# Patient Record
Sex: Male | Born: 1985
Health system: Southern US, Community
[De-identification: ages and names within clinical notes are randomized; demographics above are authoritative.]

## PROBLEM LIST (undated history)

## (undated) DIAGNOSIS — K644 Residual hemorrhoidal skin tags: Secondary | ICD-10-CM

## (undated) DIAGNOSIS — I4892 Unspecified atrial flutter: Secondary | ICD-10-CM

## (undated) DIAGNOSIS — K602 Anal fissure, unspecified: Secondary | ICD-10-CM

## (undated) DIAGNOSIS — I071 Rheumatic tricuspid insufficiency: Secondary | ICD-10-CM

## (undated) DIAGNOSIS — G43909 Migraine, unspecified, not intractable, without status migrainosus: Secondary | ICD-10-CM

## (undated) DIAGNOSIS — J45909 Unspecified asthma, uncomplicated: Secondary | ICD-10-CM

## (undated) DIAGNOSIS — K219 Gastro-esophageal reflux disease without esophagitis: Secondary | ICD-10-CM

## (undated) DIAGNOSIS — M1990 Unspecified osteoarthritis, unspecified site: Secondary | ICD-10-CM

## (undated) DIAGNOSIS — I340 Nonrheumatic mitral (valve) insufficiency: Secondary | ICD-10-CM

## (undated) DIAGNOSIS — I498 Other specified cardiac arrhythmias: Secondary | ICD-10-CM

## (undated) DIAGNOSIS — Z720 Tobacco use: Secondary | ICD-10-CM

## (undated) DIAGNOSIS — F1290 Cannabis use, unspecified, uncomplicated: Secondary | ICD-10-CM

## (undated) DIAGNOSIS — K5090 Crohn's disease, unspecified, without complications: Secondary | ICD-10-CM

## (undated) HISTORY — DX: Residual hemorrhoidal skin tags: K64.4

## (undated) HISTORY — DX: Anal fissure, unspecified: K60.2

## (undated) HISTORY — PX: COLONOSCOPY: SHX174

## (undated) HISTORY — DX: Gastro-esophageal reflux disease without esophagitis: K21.9

## (undated) HISTORY — DX: Crohn's disease, unspecified, without complications: K50.90

---

## 2005-08-21 ENCOUNTER — Emergency Department: Payer: Self-pay | Admitting: Emergency Medicine

## 2005-08-29 ENCOUNTER — Ambulatory Visit: Payer: Self-pay | Admitting: Gastroenterology

## 2006-02-12 ENCOUNTER — Inpatient Hospital Stay: Payer: Self-pay | Admitting: Internal Medicine

## 2006-04-24 ENCOUNTER — Inpatient Hospital Stay: Payer: Self-pay | Admitting: Internal Medicine

## 2006-06-16 ENCOUNTER — Emergency Department: Payer: Self-pay | Admitting: Emergency Medicine

## 2007-03-01 ENCOUNTER — Emergency Department: Payer: Self-pay | Admitting: Internal Medicine

## 2007-03-01 ENCOUNTER — Other Ambulatory Visit: Payer: Self-pay

## 2007-03-20 ENCOUNTER — Ambulatory Visit: Payer: Self-pay | Admitting: Urology

## 2007-05-02 ENCOUNTER — Ambulatory Visit: Payer: Self-pay | Admitting: Rheumatology

## 2007-06-25 ENCOUNTER — Emergency Department: Payer: Self-pay | Admitting: Emergency Medicine

## 2007-09-19 ENCOUNTER — Emergency Department: Payer: Self-pay | Admitting: Internal Medicine

## 2010-11-18 ENCOUNTER — Ambulatory Visit: Payer: Self-pay | Admitting: Unknown Physician Specialty

## 2010-12-06 ENCOUNTER — Ambulatory Visit: Payer: Self-pay | Admitting: Unknown Physician Specialty

## 2011-12-02 DIAGNOSIS — L259 Unspecified contact dermatitis, unspecified cause: Secondary | ICD-10-CM | POA: Diagnosis not present

## 2011-12-02 DIAGNOSIS — L708 Other acne: Secondary | ICD-10-CM | POA: Diagnosis not present

## 2012-01-04 DIAGNOSIS — L708 Other acne: Secondary | ICD-10-CM | POA: Diagnosis not present

## 2012-01-04 DIAGNOSIS — L259 Unspecified contact dermatitis, unspecified cause: Secondary | ICD-10-CM | POA: Diagnosis not present

## 2012-01-06 DIAGNOSIS — M459 Ankylosing spondylitis of unspecified sites in spine: Secondary | ICD-10-CM | POA: Diagnosis not present

## 2012-01-16 DIAGNOSIS — R21 Rash and other nonspecific skin eruption: Secondary | ICD-10-CM | POA: Diagnosis not present

## 2012-01-16 DIAGNOSIS — A630 Anogenital (venereal) warts: Secondary | ICD-10-CM | POA: Diagnosis not present

## 2012-03-24 DIAGNOSIS — I889 Nonspecific lymphadenitis, unspecified: Secondary | ICD-10-CM | POA: Diagnosis not present

## 2012-03-24 DIAGNOSIS — J069 Acute upper respiratory infection, unspecified: Secondary | ICD-10-CM | POA: Diagnosis not present

## 2012-03-24 DIAGNOSIS — K5090 Crohn's disease, unspecified, without complications: Secondary | ICD-10-CM | POA: Diagnosis not present

## 2012-04-26 DIAGNOSIS — L738 Other specified follicular disorders: Secondary | ICD-10-CM | POA: Diagnosis not present

## 2012-04-26 DIAGNOSIS — L299 Pruritus, unspecified: Secondary | ICD-10-CM | POA: Diagnosis not present

## 2012-04-26 DIAGNOSIS — D485 Neoplasm of uncertain behavior of skin: Secondary | ICD-10-CM | POA: Diagnosis not present

## 2012-04-26 DIAGNOSIS — R21 Rash and other nonspecific skin eruption: Secondary | ICD-10-CM | POA: Diagnosis not present

## 2012-05-03 DIAGNOSIS — B350 Tinea barbae and tinea capitis: Secondary | ICD-10-CM | POA: Diagnosis not present

## 2012-05-03 DIAGNOSIS — L738 Other specified follicular disorders: Secondary | ICD-10-CM | POA: Diagnosis not present

## 2012-05-15 DIAGNOSIS — R109 Unspecified abdominal pain: Secondary | ICD-10-CM | POA: Diagnosis not present

## 2012-05-15 DIAGNOSIS — R112 Nausea with vomiting, unspecified: Secondary | ICD-10-CM | POA: Diagnosis not present

## 2012-05-15 DIAGNOSIS — K5090 Crohn's disease, unspecified, without complications: Secondary | ICD-10-CM | POA: Diagnosis not present

## 2012-06-22 DIAGNOSIS — K5090 Crohn's disease, unspecified, without complications: Secondary | ICD-10-CM | POA: Diagnosis not present

## 2012-06-22 DIAGNOSIS — K2900 Acute gastritis without bleeding: Secondary | ICD-10-CM | POA: Diagnosis not present

## 2012-06-22 DIAGNOSIS — A048 Other specified bacterial intestinal infections: Secondary | ICD-10-CM | POA: Diagnosis not present

## 2012-07-02 DIAGNOSIS — B360 Pityriasis versicolor: Secondary | ICD-10-CM | POA: Diagnosis not present

## 2012-07-02 DIAGNOSIS — B350 Tinea barbae and tinea capitis: Secondary | ICD-10-CM | POA: Diagnosis not present

## 2012-07-02 DIAGNOSIS — L738 Other specified follicular disorders: Secondary | ICD-10-CM | POA: Diagnosis not present

## 2012-07-06 DIAGNOSIS — M459 Ankylosing spondylitis of unspecified sites in spine: Secondary | ICD-10-CM | POA: Diagnosis not present

## 2012-07-20 DIAGNOSIS — K5090 Crohn's disease, unspecified, without complications: Secondary | ICD-10-CM | POA: Diagnosis not present

## 2012-08-02 ENCOUNTER — Ambulatory Visit: Payer: Self-pay | Admitting: Gastroenterology

## 2012-08-02 DIAGNOSIS — K5289 Other specified noninfective gastroenteritis and colitis: Secondary | ICD-10-CM | POA: Diagnosis not present

## 2012-08-02 DIAGNOSIS — K5090 Crohn's disease, unspecified, without complications: Secondary | ICD-10-CM | POA: Diagnosis not present

## 2012-08-02 DIAGNOSIS — K5010 Crohn's disease of large intestine without complications: Secondary | ICD-10-CM | POA: Diagnosis not present

## 2012-08-02 LAB — HM COLONOSCOPY

## 2012-08-03 LAB — PATHOLOGY REPORT

## 2012-08-29 DIAGNOSIS — K5010 Crohn's disease of large intestine without complications: Secondary | ICD-10-CM | POA: Diagnosis not present

## 2013-01-11 DIAGNOSIS — M459 Ankylosing spondylitis of unspecified sites in spine: Secondary | ICD-10-CM | POA: Diagnosis not present

## 2013-02-14 DIAGNOSIS — B360 Pityriasis versicolor: Secondary | ICD-10-CM | POA: Diagnosis not present

## 2013-02-14 DIAGNOSIS — A630 Anogenital (venereal) warts: Secondary | ICD-10-CM | POA: Diagnosis not present

## 2013-02-14 DIAGNOSIS — L738 Other specified follicular disorders: Secondary | ICD-10-CM | POA: Diagnosis not present

## 2013-02-14 DIAGNOSIS — R21 Rash and other nonspecific skin eruption: Secondary | ICD-10-CM | POA: Diagnosis not present

## 2013-04-04 DIAGNOSIS — A5601 Chlamydial cystitis and urethritis: Secondary | ICD-10-CM | POA: Diagnosis not present

## 2013-04-04 DIAGNOSIS — R319 Hematuria, unspecified: Secondary | ICD-10-CM | POA: Diagnosis not present

## 2013-04-04 DIAGNOSIS — K644 Residual hemorrhoidal skin tags: Secondary | ICD-10-CM | POA: Diagnosis not present

## 2013-04-04 DIAGNOSIS — Z2089 Contact with and (suspected) exposure to other communicable diseases: Secondary | ICD-10-CM | POA: Diagnosis not present

## 2013-04-04 DIAGNOSIS — K5090 Crohn's disease, unspecified, without complications: Secondary | ICD-10-CM | POA: Diagnosis not present

## 2013-08-20 DIAGNOSIS — Z2089 Contact with and (suspected) exposure to other communicable diseases: Secondary | ICD-10-CM | POA: Diagnosis not present

## 2013-08-20 DIAGNOSIS — R490 Dysphonia: Secondary | ICD-10-CM | POA: Diagnosis not present

## 2013-08-20 DIAGNOSIS — K5090 Crohn's disease, unspecified, without complications: Secondary | ICD-10-CM | POA: Diagnosis not present

## 2013-08-30 DIAGNOSIS — J383 Other diseases of vocal cords: Secondary | ICD-10-CM | POA: Diagnosis not present

## 2013-08-30 DIAGNOSIS — J351 Hypertrophy of tonsils: Secondary | ICD-10-CM | POA: Diagnosis not present

## 2013-08-30 DIAGNOSIS — J039 Acute tonsillitis, unspecified: Secondary | ICD-10-CM | POA: Diagnosis not present

## 2013-08-30 DIAGNOSIS — R490 Dysphonia: Secondary | ICD-10-CM | POA: Diagnosis not present

## 2013-09-05 DIAGNOSIS — L259 Unspecified contact dermatitis, unspecified cause: Secondary | ICD-10-CM | POA: Diagnosis not present

## 2013-09-05 DIAGNOSIS — L738 Other specified follicular disorders: Secondary | ICD-10-CM | POA: Diagnosis not present

## 2013-09-05 DIAGNOSIS — L299 Pruritus, unspecified: Secondary | ICD-10-CM | POA: Diagnosis not present

## 2013-09-05 DIAGNOSIS — R21 Rash and other nonspecific skin eruption: Secondary | ICD-10-CM | POA: Diagnosis not present

## 2013-09-13 DIAGNOSIS — J3501 Chronic tonsillitis: Secondary | ICD-10-CM | POA: Diagnosis not present

## 2013-09-13 DIAGNOSIS — J353 Hypertrophy of tonsils with hypertrophy of adenoids: Secondary | ICD-10-CM | POA: Diagnosis not present

## 2013-09-24 DIAGNOSIS — M479 Spondylosis, unspecified: Secondary | ICD-10-CM | POA: Diagnosis not present

## 2013-09-24 DIAGNOSIS — K5090 Crohn's disease, unspecified, without complications: Secondary | ICD-10-CM | POA: Diagnosis not present

## 2013-10-01 ENCOUNTER — Emergency Department: Payer: Self-pay | Admitting: Emergency Medicine

## 2013-10-01 DIAGNOSIS — IMO0002 Reserved for concepts with insufficient information to code with codable children: Secondary | ICD-10-CM | POA: Diagnosis not present

## 2013-10-01 DIAGNOSIS — K5090 Crohn's disease, unspecified, without complications: Secondary | ICD-10-CM | POA: Diagnosis not present

## 2013-10-01 DIAGNOSIS — M069 Rheumatoid arthritis, unspecified: Secondary | ICD-10-CM | POA: Diagnosis not present

## 2013-10-01 DIAGNOSIS — R109 Unspecified abdominal pain: Secondary | ICD-10-CM | POA: Diagnosis not present

## 2013-10-01 DIAGNOSIS — R197 Diarrhea, unspecified: Secondary | ICD-10-CM | POA: Diagnosis not present

## 2013-10-01 DIAGNOSIS — Z79899 Other long term (current) drug therapy: Secondary | ICD-10-CM | POA: Diagnosis not present

## 2013-10-01 DIAGNOSIS — J039 Acute tonsillitis, unspecified: Secondary | ICD-10-CM | POA: Diagnosis not present

## 2013-10-01 DIAGNOSIS — F17200 Nicotine dependence, unspecified, uncomplicated: Secondary | ICD-10-CM | POA: Diagnosis not present

## 2013-10-01 LAB — URINALYSIS, COMPLETE
Bacteria: NONE SEEN
Bilirubin,UR: NEGATIVE
Blood: NEGATIVE
Glucose,UR: NEGATIVE mg/dL (ref 0–75)
Leukocyte Esterase: NEGATIVE
Nitrite: NEGATIVE
PH: 6 (ref 4.5–8.0)
Specific Gravity: 1.026 (ref 1.003–1.030)
Squamous Epithelial: 1
WBC UR: 3 /HPF (ref 0–5)

## 2013-10-01 LAB — COMPREHENSIVE METABOLIC PANEL
ALK PHOS: 149 U/L — AB
ALT: 47 U/L
ANION GAP: 9 (ref 7–16)
AST: 22 U/L (ref 15–37)
Albumin: 3.7 g/dL (ref 3.4–5.0)
BUN: 12 mg/dL (ref 7–18)
Bilirubin,Total: 0.4 mg/dL (ref 0.2–1.0)
CALCIUM: 9 mg/dL (ref 8.5–10.1)
CHLORIDE: 103 mmol/L (ref 98–107)
CREATININE: 1.02 mg/dL (ref 0.60–1.30)
Co2: 24 mmol/L (ref 21–32)
EGFR (African American): >60
EGFR (Non-African Amer.): >60
Glucose: 106 mg/dL — ABNORMAL HIGH (ref 65–99)
Osmolality: 272 (ref 275–301)
POTASSIUM: 3.6 mmol/L (ref 3.5–5.1)
Sodium: 136 mmol/L (ref 136–145)
Total Protein: 7.7 g/dL (ref 6.4–8.2)

## 2013-10-01 LAB — CBC WITH DIFFERENTIAL/PLATELET
BASOS ABS: 0 10*3/uL (ref 0.0–0.1)
BASOS PCT: 0.2 %
Eosinophil #: 0 10*3/uL (ref 0.0–0.7)
Eosinophil %: 0.1 %
HCT: 45.7 % (ref 40.0–52.0)
HGB: 15.8 g/dL (ref 13.0–18.0)
LYMPHS PCT: 10.5 %
Lymphocyte #: 1.7 10*3/uL (ref 1.0–3.6)
MCH: 34 pg (ref 26.0–34.0)
MCHC: 34.6 g/dL (ref 32.0–36.0)
MCV: 98 fL (ref 80–100)
Monocyte #: 1.6 x10 3/mm — ABNORMAL HIGH (ref 0.2–1.0)
Monocyte %: 10.1 %
NEUTROS ABS: 12.6 10*3/uL — AB (ref 1.4–6.5)
Neutrophil %: 79.1 %
Platelet: 228 10*3/uL (ref 150–440)
RBC: 4.65 10*6/uL (ref 4.40–5.90)
RDW: 12.5 % (ref 11.5–14.5)
WBC: 15.9 10*3/uL — AB (ref 3.8–10.6)

## 2013-10-01 LAB — TROPONIN I: Troponin-I: <0.02 ng/mL

## 2013-10-01 LAB — LIPASE, BLOOD: LIPASE: 97 U/L (ref 73–393)

## 2013-10-03 DIAGNOSIS — J3501 Chronic tonsillitis: Secondary | ICD-10-CM | POA: Diagnosis not present

## 2013-10-03 DIAGNOSIS — J039 Acute tonsillitis, unspecified: Secondary | ICD-10-CM | POA: Diagnosis not present

## 2013-10-03 DIAGNOSIS — J351 Hypertrophy of tonsils: Secondary | ICD-10-CM | POA: Diagnosis not present

## 2013-10-04 LAB — BETA STREP CULTURE(ARMC)

## 2013-10-16 ENCOUNTER — Ambulatory Visit: Payer: Self-pay | Admitting: Otolaryngology

## 2013-10-16 DIAGNOSIS — K5090 Crohn's disease, unspecified, without complications: Secondary | ICD-10-CM | POA: Diagnosis not present

## 2013-10-16 DIAGNOSIS — J351 Hypertrophy of tonsils: Secondary | ICD-10-CM | POA: Diagnosis not present

## 2013-10-16 DIAGNOSIS — J3501 Chronic tonsillitis: Secondary | ICD-10-CM | POA: Diagnosis not present

## 2013-10-16 DIAGNOSIS — M069 Rheumatoid arthritis, unspecified: Secondary | ICD-10-CM | POA: Diagnosis not present

## 2013-10-16 DIAGNOSIS — Z885 Allergy status to narcotic agent status: Secondary | ICD-10-CM | POA: Diagnosis not present

## 2013-10-16 DIAGNOSIS — F17200 Nicotine dependence, unspecified, uncomplicated: Secondary | ICD-10-CM | POA: Diagnosis not present

## 2013-10-16 DIAGNOSIS — Z79899 Other long term (current) drug therapy: Secondary | ICD-10-CM | POA: Diagnosis not present

## 2013-10-23 DIAGNOSIS — L678 Other hair color and hair shaft abnormalities: Secondary | ICD-10-CM | POA: Diagnosis not present

## 2013-10-23 DIAGNOSIS — L280 Lichen simplex chronicus: Secondary | ICD-10-CM | POA: Diagnosis not present

## 2013-10-23 DIAGNOSIS — L259 Unspecified contact dermatitis, unspecified cause: Secondary | ICD-10-CM | POA: Diagnosis not present

## 2013-10-23 DIAGNOSIS — L738 Other specified follicular disorders: Secondary | ICD-10-CM | POA: Diagnosis not present

## 2013-10-23 DIAGNOSIS — L819 Disorder of pigmentation, unspecified: Secondary | ICD-10-CM | POA: Diagnosis not present

## 2013-10-23 DIAGNOSIS — L299 Pruritus, unspecified: Secondary | ICD-10-CM | POA: Diagnosis not present

## 2013-10-23 DIAGNOSIS — L2089 Other atopic dermatitis: Secondary | ICD-10-CM | POA: Diagnosis not present

## 2014-04-09 ENCOUNTER — Emergency Department: Payer: Self-pay | Admitting: Emergency Medicine

## 2014-04-09 DIAGNOSIS — K5090 Crohn's disease, unspecified, without complications: Secondary | ICD-10-CM | POA: Diagnosis not present

## 2014-04-09 DIAGNOSIS — R109 Unspecified abdominal pain: Secondary | ICD-10-CM | POA: Diagnosis not present

## 2014-04-09 DIAGNOSIS — Z79899 Other long term (current) drug therapy: Secondary | ICD-10-CM | POA: Diagnosis not present

## 2014-04-09 DIAGNOSIS — Z720 Tobacco use: Secondary | ICD-10-CM | POA: Diagnosis not present

## 2014-04-09 LAB — COMPREHENSIVE METABOLIC PANEL
ALBUMIN: 3.5 g/dL (ref 3.4–5.0)
ALK PHOS: 150 U/L — AB (ref 46–116)
ALT: 46 U/L (ref 14–63)
ANION GAP: 8 (ref 7–16)
AST: 30 U/L (ref 15–37)
BUN: 18 mg/dL (ref 7–18)
Bilirubin,Total: 0.9 mg/dL (ref 0.2–1.0)
CALCIUM: 8.5 mg/dL (ref 8.5–10.1)
Chloride: 105 mmol/L (ref 98–107)
Co2: 25 mmol/L (ref 21–32)
Creatinine: 1.06 mg/dL (ref 0.60–1.30)
EGFR (Non-African Amer.): >60
GLUCOSE: 120 mg/dL — AB (ref 65–99)
OSMOLALITY: 279 (ref 275–301)
Potassium: 3.9 mmol/L (ref 3.5–5.1)
Sodium: 138 mmol/L (ref 136–145)
Total Protein: 7 g/dL (ref 6.4–8.2)

## 2014-04-09 LAB — URINALYSIS, COMPLETE
BLOOD: NEGATIVE
Bilirubin,UR: NEGATIVE
Glucose,UR: NEGATIVE mg/dL (ref 0–75)
KETONE: NEGATIVE
Leukocyte Esterase: NEGATIVE
Nitrite: NEGATIVE
PROTEIN: NEGATIVE
Ph: 5 (ref 4.5–8.0)
RBC,UR: 1 /HPF (ref 0–5)
Specific Gravity: 1.03 (ref 1.003–1.030)
Squamous Epithelial: <1

## 2014-04-09 LAB — CBC WITH DIFFERENTIAL/PLATELET
BASOS ABS: 0 10*3/uL (ref 0.0–0.1)
Basophil %: 0.3 %
EOS PCT: 0.3 %
Eosinophil #: 0.1 10*3/uL (ref 0.0–0.7)
HCT: 44.6 % (ref 40.0–52.0)
HGB: 14.9 g/dL (ref 13.0–18.0)
LYMPHS ABS: 0.8 10*3/uL — AB (ref 1.0–3.6)
Lymphocyte %: 4.9 %
MCH: 33.2 pg (ref 26.0–34.0)
MCHC: 33.5 g/dL (ref 32.0–36.0)
MCV: 99 fL (ref 80–100)
MONO ABS: 0.6 x10 3/mm (ref 0.2–1.0)
Monocyte %: 3.6 %
Neutrophil #: 15.4 10*3/uL — ABNORMAL HIGH (ref 1.4–6.5)
Neutrophil %: 90.9 %
Platelet: 247 10*3/uL (ref 150–440)
RBC: 4.5 10*6/uL (ref 4.40–5.90)
RDW: 12.4 % (ref 11.5–14.5)
WBC: 17 10*3/uL — ABNORMAL HIGH (ref 3.8–10.6)

## 2014-04-11 DIAGNOSIS — R5383 Other fatigue: Secondary | ICD-10-CM | POA: Diagnosis not present

## 2014-04-11 DIAGNOSIS — K5090 Crohn's disease, unspecified, without complications: Secondary | ICD-10-CM | POA: Diagnosis not present

## 2014-04-14 DIAGNOSIS — K5010 Crohn's disease of large intestine without complications: Secondary | ICD-10-CM | POA: Diagnosis not present

## 2014-05-01 DIAGNOSIS — M4690 Unspecified inflammatory spondylopathy, site unspecified: Secondary | ICD-10-CM | POA: Diagnosis not present

## 2014-07-04 DIAGNOSIS — Z113 Encounter for screening for infections with a predominantly sexual mode of transmission: Secondary | ICD-10-CM | POA: Diagnosis not present

## 2014-07-04 DIAGNOSIS — K5090 Crohn's disease, unspecified, without complications: Secondary | ICD-10-CM | POA: Diagnosis not present

## 2014-07-04 DIAGNOSIS — L299 Pruritus, unspecified: Secondary | ICD-10-CM | POA: Diagnosis not present

## 2014-07-04 DIAGNOSIS — Z112 Encounter for screening for other bacterial diseases: Secondary | ICD-10-CM | POA: Diagnosis not present

## 2014-07-04 LAB — BASIC METABOLIC PANEL
BUN: 15 mg/dL (ref 4–21)
Creatinine: 1 mg/dL (ref 0.6–1.3)
GLUCOSE: 74 mg/dL
Potassium: 4.3 mmol/L (ref 3.4–5.3)
Sodium: 143 mmol/L (ref 137–147)

## 2014-07-04 LAB — CBC AND DIFFERENTIAL
HEMATOCRIT: 44 % (ref 41–53)
HEMOGLOBIN: 15 g/dL (ref 13.5–17.5)
PLATELETS: 240 10*3/uL (ref 150–399)
WBC: 8.7 10^3/mL

## 2014-07-04 LAB — HEPATIC FUNCTION PANEL
ALT: 38 U/L (ref 10–40)
AST: 32 U/L (ref 14–40)

## 2014-09-03 DIAGNOSIS — L819 Disorder of pigmentation, unspecified: Secondary | ICD-10-CM | POA: Diagnosis not present

## 2014-09-03 DIAGNOSIS — L738 Other specified follicular disorders: Secondary | ICD-10-CM | POA: Diagnosis not present

## 2014-09-03 DIAGNOSIS — R21 Rash and other nonspecific skin eruption: Secondary | ICD-10-CM | POA: Diagnosis not present

## 2014-10-30 DIAGNOSIS — M4690 Unspecified inflammatory spondylopathy, site unspecified: Secondary | ICD-10-CM | POA: Diagnosis not present

## 2014-11-03 DIAGNOSIS — Z79899 Other long term (current) drug therapy: Secondary | ICD-10-CM | POA: Diagnosis not present

## 2014-11-03 DIAGNOSIS — L738 Other specified follicular disorders: Secondary | ICD-10-CM | POA: Diagnosis not present

## 2014-11-03 DIAGNOSIS — L732 Hidradenitis suppurativa: Secondary | ICD-10-CM | POA: Diagnosis not present

## 2014-11-03 DIAGNOSIS — L708 Other acne: Secondary | ICD-10-CM | POA: Diagnosis not present

## 2015-02-04 ENCOUNTER — Telehealth: Payer: Self-pay | Admitting: Family Medicine

## 2015-02-04 NOTE — Telephone Encounter (Signed)
Patient called office today with concerns with blood in his stool for the past 3-4 days. Patient describes blood as dark but denies any blood clots. Patient denies symptoms of light headiness, dizziness, weakness or fever. Patient does have a history of chron's disease and previously was seen at [facility].I patient states that he has not spoken with specialist or seen them in a while. Patient states that he would like to be seen by Dr. Caryn Section only but has issues with transportation. Patient was given appt for Friday, I spoke with Dr. Caryn Section and consulted him in regards to patients condition. Per Dr. Caryn Section okay for patient to keep appt if symptoms of light headiness, dizziness, weakness or fever occur then patient was advised to seek immediate medical attention at the ER.

## 2015-02-06 ENCOUNTER — Encounter: Payer: Self-pay | Admitting: Family Medicine

## 2015-02-06 ENCOUNTER — Ambulatory Visit (INDEPENDENT_AMBULATORY_CARE_PROVIDER_SITE_OTHER): Payer: Medicare Other | Admitting: Family Medicine

## 2015-02-06 VITALS — BP 108/66 | HR 63 | Temp 97.0°F | Resp 16 | Ht 72.0 in | Wt 155.0 lb

## 2015-02-06 DIAGNOSIS — K5090 Crohn's disease, unspecified, without complications: Secondary | ICD-10-CM | POA: Insufficient documentation

## 2015-02-06 DIAGNOSIS — K625 Hemorrhage of anus and rectum: Secondary | ICD-10-CM | POA: Diagnosis not present

## 2015-02-06 DIAGNOSIS — M47819 Spondylosis without myelopathy or radiculopathy, site unspecified: Secondary | ICD-10-CM | POA: Insufficient documentation

## 2015-02-06 DIAGNOSIS — K219 Gastro-esophageal reflux disease without esophagitis: Secondary | ICD-10-CM | POA: Insufficient documentation

## 2015-02-06 DIAGNOSIS — R5383 Other fatigue: Secondary | ICD-10-CM | POA: Diagnosis not present

## 2015-02-06 DIAGNOSIS — K50919 Crohn's disease, unspecified, with unspecified complications: Secondary | ICD-10-CM | POA: Diagnosis not present

## 2015-02-06 MED ORDER — PREDNISONE 10 MG PO TABS: ORAL_TABLET | ORAL | Status: AC

## 2015-02-06 NOTE — Progress Notes (Signed)
Patient ID: Marcus Gordon, male   DOB: 1985/07/04, 29 y.o.   MRN: [id number]        Patient: Marcus Gordon Male    DOB: [birth year]   29 y.o.   MRN: [id number] Visit Date: 02/06/2015  Today's Provider: Lelon Huh, MD   Chief Complaint  Patient presents with  . Blood In Stools   Subjective:    HPI  Blood in Stool: Patient presents for presents evaluation of blood in stool/ rectal bleeding. Patient has associated symptoms of visible blood: heavy. The patient denies constipation.  The patient has a known history of: Crohns colitis. The patient has had about every day 3 times a day episodes of rectal bleeding.  There is not a history of rectal injury. Patient has similar episodes of rectal bleeding in the past. Bleeding starting about a week ago. Crohn's disease had been pretty well controlled, last colonoscopy was in 2014. Previously has responded very well to steroids. He has follow up with Dr. Jefm Bryant on 02-18-15  Wt Readings from Last 3 Encounters:  02/06/15 155 lb (70.308 kg)  07/04/14 154 lb 9.6 oz (70.126 kg)       Allergies  Allergen Reactions  . Aspirin   . Morphine And Related   . Other     Other reaction(s): Other (See Comments) Anti-inflammatory-GI upset  . Remicade [Infliximab]     Gastrointestinal agents  . Dilaudid [Hydromorphone Hcl] Rash    Itching   Previous Medications   ADALIMUMAB (HUMIRA PEN) 40 MG/0.8ML PNKT    Inject into the skin.   DOXEPIN HCL (ZONALON) 5 % CREA    Apply topically 4 (four) times daily.    Review of Systems  Constitutional: Positive for fatigue and unexpected weight change. Negative for fever, chills and appetite change.  Respiratory: Negative for chest tightness, shortness of breath and wheezing.   Cardiovascular: Negative for chest pain and palpitations.  Gastrointestinal: Positive for abdominal pain, diarrhea, blood in stool and anal bleeding. Negative for nausea and vomiting.    Social History  Substance Use Topics    . Smoking status: Current Every Day Smoker -- 1.00 packs/day    Types: Cigarettes  . Smokeless tobacco: Never Used  . Alcohol Use: Yes   Objective:   BP 108/66 mmHg  Pulse 63  Temp(Src) 97 F (36.1 C) (Oral)  Resp 16  Ht 6' (1.829 m)  Wt 155 lb (70.308 kg)  BMI 21.02 kg/m2  SpO2 98%  Physical Exam   General Appearance:    Alert, cooperative, no distress  Eyes:    PERRL, conjunctiva/corneas clear, EOM's intact       Lungs:     Clear to auscultation bilaterally, respirations unlabored  Heart:    Regular rate and rhythm  Neurologic:   Awake, alert, oriented x 3. No apparent focal neurological           defect.   Rectal:   No masses, no unusual tenderness. Scant amount of blood tinged stool.        Assessment & Plan:      1. Crohn's disease with complication, unspecified gastrointestinal tract location [facility]) Recent onset exacerbation - predniSONE (DELTASONE) 10 MG tablet; 6 tablets for 2 days, then 5 for 2 days, then 4 for 2 days, then 3 for 2 days, then 2 for 2 days, then 1 for 2 days.  Dispense: 42 tablet; Refill: 0  2. Blood per rectum No external lesions or other lesions near anal canal. Likely due to  crohn's flare.   3. Other fatigue  - CBC - Comprehensive metabolic panel        Lelon Huh, MD  [facility]

## 2015-02-07 LAB — COMPREHENSIVE METABOLIC PANEL
ALBUMIN: 4.3 g/dL (ref 3.5–5.5)
ALK PHOS: 169 IU/L — AB (ref 39–117)
ALT: 43 IU/L (ref 0–44)
AST: 38 IU/L (ref 0–40)
Albumin/Globulin Ratio: 1.6 (ref 1.1–2.5)
BILIRUBIN TOTAL: 0.7 mg/dL (ref 0.0–1.2)
BUN / CREAT RATIO: 17 (ref 8–19)
BUN: 16 mg/dL (ref 6–20)
CHLORIDE: 103 mmol/L (ref 97–106)
CO2: 25 mmol/L (ref 18–29)
Calcium: 9.5 mg/dL (ref 8.7–10.2)
Creatinine, Ser: 0.94 mg/dL (ref 0.76–1.27)
GFR calc Af Amer: 126 mL/min/{1.73_m2} (ref >59)
GFR calc non Af Amer: 109 mL/min/{1.73_m2} (ref >59)
GLUCOSE: 87 mg/dL (ref 65–99)
Globulin, Total: 2.7 g/dL (ref 1.5–4.5)
Potassium: 4.7 mmol/L (ref 3.5–5.2)
Sodium: 140 mmol/L (ref 136–144)
Total Protein: 7 g/dL (ref 6.0–8.5)

## 2015-02-07 LAB — CBC
Hematocrit: 46.2 % (ref 37.5–51.0)
Hemoglobin: 15.4 g/dL (ref 12.6–17.7)
MCH: 31.6 pg (ref 26.6–33.0)
MCHC: 33.3 g/dL (ref 31.5–35.7)
MCV: 95 fL (ref 79–97)
PLATELETS: 298 10*3/uL (ref 150–379)
RBC: 4.87 x10E6/uL (ref 4.14–5.80)
RDW: 13.3 % (ref 12.3–15.4)
WBC: 10.2 10*3/uL (ref 3.4–10.8)

## 2015-02-10 ENCOUNTER — Other Ambulatory Visit: Payer: Self-pay | Admitting: Family Medicine

## 2015-02-10 DIAGNOSIS — K50919 Crohn's disease, unspecified, with unspecified complications: Secondary | ICD-10-CM

## 2015-02-10 NOTE — Progress Notes (Unsigned)
Please refer to Dr Tiffany Kocher for Crohn's disease

## 2015-02-13 ENCOUNTER — Telehealth: Payer: Self-pay

## 2015-02-13 NOTE — Telephone Encounter (Signed)
Advised patient as below. Patient reports that he would be willing to see Dr. Allen Norris again.

## 2015-02-13 NOTE — Telephone Encounter (Signed)
-----   Message from Birdie Sons, MD sent at 02/07/2015  8:43 AM EST ----- Labs are good. Blood count is normal. Blood in stool should stop after being on prednisone for a few day. He needs to schedule follow up with GI for Crohn's disease. Let me know if he wants to go back to Dr. Allen Norris or referral to another gastroenterologist.

## 2015-02-16 NOTE — Telephone Encounter (Signed)
I think this is already in progress.

## 2015-02-19 ENCOUNTER — Encounter: Payer: Self-pay | Admitting: Gastroenterology

## 2015-02-19 ENCOUNTER — Ambulatory Visit (INDEPENDENT_AMBULATORY_CARE_PROVIDER_SITE_OTHER): Payer: Medicare Other | Admitting: Gastroenterology

## 2015-02-19 VITALS — BP 114/65 | HR 72 | Temp 98.0°F | Ht 72.0 in | Wt 157.0 lb

## 2015-02-19 DIAGNOSIS — A4902 Methicillin resistant Staphylococcus aureus infection, unspecified site: Secondary | ICD-10-CM | POA: Insufficient documentation

## 2015-02-19 DIAGNOSIS — K50119 Crohn's disease of large intestine with unspecified complications: Secondary | ICD-10-CM

## 2015-02-19 DIAGNOSIS — K5010 Crohn's disease of large intestine without complications: Secondary | ICD-10-CM | POA: Insufficient documentation

## 2015-02-19 DIAGNOSIS — M1990 Unspecified osteoarthritis, unspecified site: Secondary | ICD-10-CM | POA: Insufficient documentation

## 2015-02-19 DIAGNOSIS — R109 Unspecified abdominal pain: Secondary | ICD-10-CM | POA: Insufficient documentation

## 2015-02-19 NOTE — Progress Notes (Signed)
   Primary Care Physician: Carmon Ginsberg, PA  Primary Gastroenterologist:  Dr. Lucilla Lame  Chief Complaint  Patient presents with  . follow up crohn's disease    diarrhea and blood    HPI: Marcus Gordon is a 29 y.o. male here for follow-up of his Crohn's disease. The patient reports that he has been rationing his Humira recently and states that he has been having bloody diarrhea with abdominal pain and bloating. There is no report of any nausea vomiting but the patient states he is feeling very weak.   Current Outpatient Prescriptions  Medication Sig Dispense Refill  . Adalimumab (HUMIRA PEN) 40 MG/0.8ML PNKT Inject into the skin.    . Doxepin HCl (ZONALON) 5 % CREA Apply topically 4 (four) times daily.     No current facility-administered medications for this visit.    Allergies as of 02/19/2015 - Review Complete 02/19/2015  Allergen Reaction Noted  . Aspirin  02/06/2015  . Morphine and related  02/06/2015  . Other  02/06/2015  . Remicade [infliximab]  02/06/2015  . Dilaudid [hydromorphone hcl] Rash 02/06/2015    ROS:  General: Negative for anorexia, weight loss, fever, chills, fatigue, weakness. ENT: Negative for hoarseness, difficulty swallowing , nasal congestion. CV: Negative for chest pain, angina, palpitations, dyspnea on exertion, peripheral edema.  Respiratory: Negative for dyspnea at rest, dyspnea on exertion, cough, sputum, wheezing.  GI: See history of present illness. GU:  Negative for dysuria, hematuria, urinary incontinence, urinary frequency, nocturnal urination.  Endo: Negative for unusual weight change.    Physical Examination:   BP 114/65 mmHg  Pulse 72  Temp(Src) 98 F (36.7 C) (Oral)  Ht 6' (1.829 m)  Wt 157 lb (71.215 kg)  BMI 21.29 kg/m2  General: Well-nourished, well-developed in no acute distress.  Eyes: No icterus. Conjunctivae pink. Mouth: Oropharyngeal mucosa moist and pink , no lesions erythema or exudate. Lungs: Clear to  auscultation bilaterally. Non-labored. Heart: Regular rate and rhythm, no murmurs rubs or gallops.  Abdomen: Bowel sounds are normal, nontender, nondistended, no hepatosplenomegaly or masses, no abdominal bruits or hernia , no rebound or guarding.   Extremities: No lower extremity edema. No clubbing or deformities. Neuro: Alert and oriented x 3.  Grossly intact. Skin: Warm and dry, no jaundice.   Psych: Alert and cooperative, normal mood and affect.  Labs:    Imaging Studies: No results found.  Assessment and Plan:   Marcus Gordon is a 28 y.o. y/o male with a history of Crohn's disease who is now having a flare of Crohn's while on Humira. The patient will be started on a steroid taper with 60 mg for 2 weeks then 50 mg for 2 weeks and decreasing every 2 weeks by 10 mg. The patient will also have his blood sent off for TPMP prior to being started on Imuran. He will also have his blood sent off for Humira antibodies to see if he has become resistant to this. If he has he may need to have his anti-TNF change. The patient has been explained this plan and agrees with it.   Note: This dictation was prepared with Dragon dictation along with smaller phrase technology. Any transcriptional errors that result from this process are unintentional.

## 2015-02-20 DIAGNOSIS — K50011 Crohn's disease of small intestine with rectal bleeding: Secondary | ICD-10-CM | POA: Diagnosis not present

## 2015-02-20 DIAGNOSIS — M4690 Unspecified inflammatory spondylopathy, site unspecified: Secondary | ICD-10-CM | POA: Diagnosis not present

## 2015-02-26 LAB — ADALIMUMAB+AB (SERIAL MONITOR): ADALIMUMAB DRUG LEVEL: 1.5 ug/mL

## 2015-02-26 LAB — THIOPURINE METHYLTRANSFERASE (TPMT), RBC: TPMT Activity:: 19.3 Units/mL RBC

## 2015-02-27 ENCOUNTER — Telehealth: Payer: Self-pay

## 2015-02-27 MED ORDER — PREDNISONE 20 MG PO TABS: ORAL_TABLET | ORAL | Status: DC

## 2015-02-27 NOTE — Telephone Encounter (Signed)
Patient called in and doesn't know exactly what is going on with Prednisone as this was not called in to the pharmacy and also needs to know if the Humira is going to be switched because he cannot remember what Dr. Allen Norris told him at his appointment.  Prednisone taper sent to [facility] [place] at this time per Dr. Dorothey Baseman orders. Explained to patient that he was sent for antibiodies (lab work) and as soon as results come in for that, we will be able to decide if Humira needs changed or if this is ok to continue.  Please let patient know as soon as lab results.

## 2015-03-04 ENCOUNTER — Telehealth: Payer: Self-pay

## 2015-03-04 NOTE — Telephone Encounter (Signed)
-----   Message from Lucilla Lame, MD sent at 03/04/2015  9:00 AM EST ----- The patient know that the blood test did not show him to be resistant to the medication he is taking. See if he is taking it correctly at the correct intervals and that he has refills.

## 2015-03-04 NOTE — Telephone Encounter (Signed)
Advised pt I need the rx information from his previous scripts so I can send the refill in. Pt stated the information is at his mothers but she isn't currently home. Will call me back tomorrow with this. Pt also advised of lab results. He was able to pick up his prednisone from pharmacy.

## 2015-03-04 NOTE — Telephone Encounter (Signed)
Pt notified of lab results

## 2015-03-11 DIAGNOSIS — L732 Hidradenitis suppurativa: Secondary | ICD-10-CM | POA: Diagnosis not present

## 2015-03-11 DIAGNOSIS — L738 Other specified follicular disorders: Secondary | ICD-10-CM | POA: Diagnosis not present

## 2015-03-11 DIAGNOSIS — A630 Anogenital (venereal) warts: Secondary | ICD-10-CM | POA: Diagnosis not present

## 2015-07-06 ENCOUNTER — Encounter: Payer: Self-pay | Admitting: Family Medicine

## 2015-07-06 ENCOUNTER — Ambulatory Visit (INDEPENDENT_AMBULATORY_CARE_PROVIDER_SITE_OTHER): Payer: Medicare Other | Admitting: Family Medicine

## 2015-07-06 VITALS — BP 104/66 | HR 66 | Temp 97.7°F | Resp 16 | Wt 156.6 lb

## 2015-07-06 DIAGNOSIS — K50111 Crohn's disease of large intestine with rectal bleeding: Secondary | ICD-10-CM | POA: Diagnosis not present

## 2015-07-06 DIAGNOSIS — K625 Hemorrhage of anus and rectum: Secondary | ICD-10-CM

## 2015-07-06 NOTE — Patient Instructions (Signed)
We will call you with the lab result and with the referral time.

## 2015-07-06 NOTE — Progress Notes (Signed)
Subjective:     Patient ID: Marcus Gordon, male   DOB: [birth year], 30 y.o.   MRN: [id number]  HPI  Chief Complaint  Patient presents with  . Hemorrhoids    Patient comes in office today for rectal bleeding that has been occuring for months on intermittent. Patient reports that there was period of time when bleeding was consistent for a week. Patient states he has blood in his stool and when wiping. Patient was last seen by Dr.Wohl, G.I., In December of last year.  Describes the bleeding as dark red/painless and has felt what he thought was a hemorrhoid on one occasion. States he moves his bowels 1-2 x day without straining. He is due a Humira injection on 5/5. He is unsure whether he tapered his steroids correctly after his last G.I.visit. He is accompanied by his grandmother today   Review of Systems     Objective:   Physical Exam  Constitutional: He appears well-developed and well-nourished. No distress.  Abdominal: Soft. There is no tenderness.  Rectal exam without external hemorrhoid, tight external sphincter with scant heme negative stool on the glove.       Assessment:    1. Rectal bleeding - POC Hemoccult Bld/Stl (1-Cd Office Dx) - CBC with Differential/Platelet - Ambulatory referral to Gastroenterology  2. Crohn's disease of colon, with rectal bleeding ([place]) - Ambulatory referral to Gastroenterology    Plan:    Further f/u pending lab results.

## 2015-07-07 ENCOUNTER — Telehealth: Payer: Self-pay

## 2015-07-07 LAB — CBC WITH DIFFERENTIAL/PLATELET
BASOS ABS: 0 10*3/uL (ref 0.0–0.2)
Basos: 0 %
EOS (ABSOLUTE): 0.2 10*3/uL (ref 0.0–0.4)
Eos: 2 %
Hematocrit: 42.5 % (ref 37.5–51.0)
Hemoglobin: 14.5 g/dL (ref 12.6–17.7)
IMMATURE GRANS (ABS): 0 10*3/uL (ref 0.0–0.1)
IMMATURE GRANULOCYTES: 0 %
LYMPHS: 37 %
Lymphocytes Absolute: 3.2 10*3/uL — ABNORMAL HIGH (ref 0.7–3.1)
MCH: 31.9 pg (ref 26.6–33.0)
MCHC: 34.1 g/dL (ref 31.5–35.7)
MCV: 94 fL (ref 79–97)
Monocytes Absolute: 0.7 10*3/uL (ref 0.1–0.9)
Monocytes: 8 %
NEUTROS PCT: 53 %
Neutrophils Absolute: 4.6 10*3/uL (ref 1.4–7.0)
PLATELETS: 278 10*3/uL (ref 150–379)
RBC: 4.54 x10E6/uL (ref 4.14–5.80)
RDW: 13.2 % (ref 12.3–15.4)
WBC: 8.7 10*3/uL (ref 3.4–10.8)

## 2015-07-07 NOTE — Telephone Encounter (Signed)
-----   Message from Carmon Ginsberg, [place] sent at 07/07/2015  7:45 AM EDT ----- No anemia

## 2015-07-07 NOTE — Telephone Encounter (Signed)
Patient advised as directed below.  Thanks,  -Joseline 

## 2015-07-08 ENCOUNTER — Other Ambulatory Visit: Payer: Self-pay

## 2015-07-08 DIAGNOSIS — K50919 Crohn's disease, unspecified, with unspecified complications: Secondary | ICD-10-CM

## 2015-07-13 DIAGNOSIS — K50919 Crohn's disease, unspecified, with unspecified complications: Secondary | ICD-10-CM | POA: Diagnosis not present

## 2015-07-14 ENCOUNTER — Telehealth: Payer: Self-pay

## 2015-07-14 ENCOUNTER — Ambulatory Visit: Payer: Self-pay | Admitting: Gastroenterology

## 2015-07-14 LAB — C-REACTIVE PROTEIN: CRP: 2.8 mg/L (ref 0.0–4.9)

## 2015-07-14 NOTE — Telephone Encounter (Signed)
-----   Message from Lucilla Lame, MD sent at 07/14/2015 10:27 AM EDT ----- Let the patient know the C-reactive protein was negative for any ongoing inflammation.

## 2015-07-14 NOTE — Telephone Encounter (Signed)
Tried contacting pt. VM on cell was not set up. No answer or vm to leave message on home phone.

## 2015-07-15 ENCOUNTER — Other Ambulatory Visit: Payer: Self-pay

## 2015-07-15 ENCOUNTER — Encounter: Payer: Self-pay | Admitting: Gastroenterology

## 2015-07-15 ENCOUNTER — Ambulatory Visit (INDEPENDENT_AMBULATORY_CARE_PROVIDER_SITE_OTHER): Payer: Medicare Other | Admitting: Gastroenterology

## 2015-07-15 VITALS — BP 113/61 | HR 72 | Temp 98.4°F | Ht 72.0 in | Wt 154.0 lb

## 2015-07-15 DIAGNOSIS — K5000 Crohn's disease of small intestine without complications: Secondary | ICD-10-CM

## 2015-07-15 NOTE — Telephone Encounter (Signed)
Pt had an OV today and results were given at that time. Pt also set up for a colonoscopy at [facility] on 07/30/15. DX: Crohn's disease K50.00.

## 2015-07-15 NOTE — Progress Notes (Signed)
   Primary Care Physician: Carmon Ginsberg, PA  Primary Gastroenterologist:  Dr. Lucilla Lame  Chief Complaint  Patient presents with  . Follow up Crohn's disease    HPI: Marcus Gordon is a 30 y.o. male here For follow-up of his Crohn's disease. The patient has been following with me for the last 10 years for his Crohn's disease. The patient is presently on Humira and his most recent C-reactive protein was around 2. The patient states that he has some rectal pain and rectal bleeding that hurts when he has a solid bowel movement. There is no report of any passage of clots or diarrhea. The patient has denied any abdominal pain fevers chills nausea or vomiting. The patient has been doing well on the Humira.  Current Outpatient Prescriptions  Medication Sig Dispense Refill  . Adalimumab (HUMIRA PEN) 40 MG/0.8ML PNKT Inject into the skin.     No current facility-administered medications for this visit.    Allergies as of 07/15/2015 - Review Complete 02/19/2015  Allergen Reaction Noted  . Aspirin  02/06/2015  . Morphine and related  02/06/2015  . Other  02/06/2015  . Remicade [infliximab]  02/06/2015  . Dilaudid [hydromorphone hcl] Rash 02/06/2015    ROS:  General: Negative for anorexia, weight loss, fever, chills, fatigue, weakness. ENT: Negative for hoarseness, difficulty swallowing , nasal congestion. CV: Negative for chest pain, angina, palpitations, dyspnea on exertion, peripheral edema.  Respiratory: Negative for dyspnea at rest, dyspnea on exertion, cough, sputum, wheezing.  GI: See history of present illness. GU:  Negative for dysuria, hematuria, urinary incontinence, urinary frequency, nocturnal urination.  Endo: Negative for unusual weight change.    Physical Examination:   BP 113/61 mmHg  Pulse 72  Temp(Src) 98.4 F (36.9 C) (Oral)  Ht 6' (1.829 m)  Wt 154 lb (69.854 kg)  BMI 20.88 kg/m2  General: Well-nourished, well-developed in no acute distress.  Eyes: No  icterus. Conjunctivae pink. Mouth: Oropharyngeal mucosa moist and pink , no lesions erythema or exudate. Lungs: Clear to auscultation bilaterally. Non-labored. Heart: Regular rate and rhythm, no murmurs rubs or gallops.  Abdomen: Bowel sounds are normal, nontender, nondistended, no hepatosplenomegaly or masses, no abdominal bruits or hernia , no rebound or guarding.   Extremities: No lower extremity edema. No clubbing or deformities. Neuro: Alert and oriented x 3.  Grossly intact. Skin: Warm and dry, no jaundice.   Psych: Alert and cooperative, normal mood and affect.  Labs:    Imaging Studies: No results found.  Assessment and Plan:   Marcus Gordon is a 30 y.o. y/o male who comes in with a history of Crohn's disease. The patient has not had a colonoscopy at some time. The patient will be set up for colonoscopy. The patient has been told to increase fiber in his diet and avoid hard stools which thereby may be worsening his rectal bleeding from what sounds like a anal fissure. The patient will also continue his Humira. The patient and his family have been explained the plan and agree with it.   Note: This dictation was prepared with Dragon dictation along with smaller phrase technology. Any transcriptional errors that result from this process are unintentional.

## 2015-07-15 NOTE — Telephone Encounter (Signed)
-----   Message from Lucilla Lame, MD sent at 07/14/2015 10:27 AM EDT ----- Let the patient know the C-reactive protein was negative for any ongoing inflammation.

## 2015-08-10 ENCOUNTER — Encounter: Payer: Self-pay | Admitting: *Deleted

## 2015-08-10 DIAGNOSIS — Z79899 Other long term (current) drug therapy: Secondary | ICD-10-CM | POA: Diagnosis not present

## 2015-08-10 DIAGNOSIS — M0600 Rheumatoid arthritis without rheumatoid factor, unspecified site: Secondary | ICD-10-CM | POA: Diagnosis not present

## 2015-08-10 DIAGNOSIS — L700 Acne vulgaris: Secondary | ICD-10-CM | POA: Diagnosis not present

## 2015-08-10 DIAGNOSIS — Z872 Personal history of diseases of the skin and subcutaneous tissue: Secondary | ICD-10-CM | POA: Diagnosis not present

## 2015-08-10 DIAGNOSIS — L738 Other specified follicular disorders: Secondary | ICD-10-CM | POA: Diagnosis not present

## 2015-08-10 DIAGNOSIS — R21 Rash and other nonspecific skin eruption: Secondary | ICD-10-CM | POA: Diagnosis not present

## 2015-08-10 DIAGNOSIS — L732 Hidradenitis suppurativa: Secondary | ICD-10-CM | POA: Diagnosis not present

## 2015-08-11 NOTE — Discharge Instructions (Signed)

## 2015-08-13 DIAGNOSIS — R490 Dysphonia: Secondary | ICD-10-CM | POA: Diagnosis not present

## 2015-08-14 ENCOUNTER — Ambulatory Visit
Admission: RE | Admit: 2015-08-14 | Discharge: 2015-08-14 | Disposition: A | Discharge status: Home / Self Care | Payer: Medicare Other | Source: Ambulatory Visit | Attending: Gastroenterology | Admitting: Gastroenterology

## 2015-08-14 ENCOUNTER — Ambulatory Visit: Payer: Medicare Other | Admitting: Anesthesiology

## 2015-08-14 ENCOUNTER — Encounter
Admission: RE | Disposition: A | Discharge status: Home / Self Care | Payer: Self-pay | Source: Ambulatory Visit | Attending: Gastroenterology

## 2015-08-14 DIAGNOSIS — K633 Ulcer of intestine: Secondary | ICD-10-CM | POA: Diagnosis not present

## 2015-08-14 DIAGNOSIS — K602 Anal fissure, unspecified: Secondary | ICD-10-CM | POA: Insufficient documentation

## 2015-08-14 DIAGNOSIS — D124 Benign neoplasm of descending colon: Secondary | ICD-10-CM | POA: Insufficient documentation

## 2015-08-14 DIAGNOSIS — Z885 Allergy status to narcotic agent status: Secondary | ICD-10-CM | POA: Insufficient documentation

## 2015-08-14 DIAGNOSIS — Z888 Allergy status to other drugs, medicaments and biological substances status: Secondary | ICD-10-CM | POA: Insufficient documentation

## 2015-08-14 DIAGNOSIS — M160 Bilateral primary osteoarthritis of hip: Secondary | ICD-10-CM | POA: Diagnosis not present

## 2015-08-14 DIAGNOSIS — F17210 Nicotine dependence, cigarettes, uncomplicated: Secondary | ICD-10-CM | POA: Diagnosis not present

## 2015-08-14 DIAGNOSIS — K641 Second degree hemorrhoids: Secondary | ICD-10-CM | POA: Diagnosis not present

## 2015-08-14 DIAGNOSIS — J45909 Unspecified asthma, uncomplicated: Secondary | ICD-10-CM | POA: Diagnosis not present

## 2015-08-14 DIAGNOSIS — Z8719 Personal history of other diseases of the digestive system: Secondary | ICD-10-CM | POA: Diagnosis not present

## 2015-08-14 DIAGNOSIS — K635 Polyp of colon: Secondary | ICD-10-CM | POA: Diagnosis not present

## 2015-08-14 DIAGNOSIS — K219 Gastro-esophageal reflux disease without esophagitis: Secondary | ICD-10-CM | POA: Insufficient documentation

## 2015-08-14 DIAGNOSIS — K5090 Crohn's disease, unspecified, without complications: Secondary | ICD-10-CM | POA: Diagnosis not present

## 2015-08-14 DIAGNOSIS — K5000 Crohn's disease of small intestine without complications: Secondary | ICD-10-CM | POA: Diagnosis not present

## 2015-08-14 DIAGNOSIS — Z79899 Other long term (current) drug therapy: Secondary | ICD-10-CM | POA: Diagnosis not present

## 2015-08-14 DIAGNOSIS — K5289 Other specified noninfective gastroenteritis and colitis: Secondary | ICD-10-CM | POA: Diagnosis not present

## 2015-08-14 HISTORY — PX: POLYPECTOMY: SHX5525

## 2015-08-14 HISTORY — DX: Unspecified asthma, uncomplicated: J45.909

## 2015-08-14 HISTORY — DX: Unspecified osteoarthritis, unspecified site: M19.90

## 2015-08-14 HISTORY — PX: COLONOSCOPY WITH PROPOFOL: SHX5780

## 2015-08-14 SURGERY — COLONOSCOPY WITH PROPOFOL
Anesthesia: Monitor Anesthesia Care | Wound class: Contaminated

## 2015-08-14 MED ORDER — STERILE WATER FOR IRRIGATION IR SOLN
Status: DC | PRN
  Administered 2015-08-14: 09:00:00

## 2015-08-14 MED ORDER — PROPOFOL 10 MG/ML IV BOLUS
INTRAVENOUS | Status: DC | PRN
  Administered 2015-08-14: 100 mg via INTRAVENOUS
  Administered 2015-08-14: 40 mg via INTRAVENOUS
  Administered 2015-08-14: 15 mg via INTRAVENOUS
  Administered 2015-08-14: 20 mg via INTRAVENOUS
  Administered 2015-08-14: 40 mg via INTRAVENOUS
  Administered 2015-08-14: 15 mg via INTRAVENOUS

## 2015-08-14 MED ORDER — LACTATED RINGERS IV SOLN
INTRAVENOUS | Status: DC
  Administered 2015-08-14: 09:00:00 via INTRAVENOUS

## 2015-08-14 MED ORDER — LIDOCAINE HCL (CARDIAC) 20 MG/ML IV SOLN
INTRAVENOUS | Status: DC | PRN
  Administered 2015-08-14: 40 mg via INTRAVENOUS

## 2015-08-14 MED ORDER — SODIUM CHLORIDE 0.9 % IV SOLN: INTRAVENOUS | Status: DC

## 2015-08-14 SURGICAL SUPPLY — 22 items
CANISTER SUCT 1200ML W/VALVE (MISCELLANEOUS) ×4 IMPLANT
CLIP HMST 235XBRD CATH ROT (MISCELLANEOUS) IMPLANT
CLIP RESOLUTION 360 11X235 (MISCELLANEOUS)
FCP ESCP3.2XJMB 240X2.8X (MISCELLANEOUS)
FORCEPS BIOP RAD 4 LRG CAP 4 (CUTTING FORCEPS) ×4 IMPLANT
FORCEPS BIOP RJ4 240 W/NDL (MISCELLANEOUS)
FORCEPS ESCP3.2XJMB 240X2.8X (MISCELLANEOUS) IMPLANT
GOWN CVR UNV OPN BCK APRN NK (MISCELLANEOUS) ×4 IMPLANT
GOWN ISOL THUMB LOOP REG UNIV (MISCELLANEOUS) ×4
INJECTOR VARIJECT VIN23 (MISCELLANEOUS) IMPLANT
KIT DEFENDO VALVE AND CONN (KITS) IMPLANT
KIT ENDO PROCEDURE OLY (KITS) ×4 IMPLANT
MARKER SPOT ENDO TATTOO 5ML (MISCELLANEOUS) IMPLANT
PAD GROUND ADULT SPLIT (MISCELLANEOUS) IMPLANT
PROBE APC STR FIRE (PROBE) IMPLANT
SNARE SHORT THROW 13M SML OVAL (MISCELLANEOUS) IMPLANT
SNARE SHORT THROW 30M LRG OVAL (MISCELLANEOUS) IMPLANT
SNARE SNG USE RND 15MM (INSTRUMENTS) IMPLANT
SPOT EX ENDOSCOPIC TATTOO (MISCELLANEOUS)
TRAP ETRAP POLY (MISCELLANEOUS) IMPLANT
VARIJECT INJECTOR VIN23 (MISCELLANEOUS)
WATER STERILE IRR 250ML POUR (IV SOLUTION) ×4 IMPLANT

## 2015-08-14 NOTE — Anesthesia Postprocedure Evaluation (Signed)
Anesthesia Post Note  Patient: Marcus Gordon  Procedure(s) Performed: Procedure(s) (LRB): COLONOSCOPY WITH PROPOFOL (N/A) POLYPECTOMY  Patient location during evaluation: PACU Anesthesia Type: MAC Level of consciousness: awake and alert Pain management: pain level controlled Vital Signs Assessment: post-procedure vital signs reviewed and stable Respiratory status: spontaneous breathing and respiratory function stable Cardiovascular status: stable Postop Assessment: no headache Anesthetic complications: no    Jaci Standard, III,  Uday Jantz D

## 2015-08-14 NOTE — Transfer of Care (Signed)
Immediate Anesthesia Transfer of Care Note  Patient: Marcus Gordon  Procedure(s) Performed: Procedure(s): COLONOSCOPY WITH PROPOFOL (N/A) POLYPECTOMY  Patient Location: PACU  Anesthesia Type: MAC  Level of Consciousness: awake, alert  and patient cooperative  Airway and Oxygen Therapy: Patient Spontanous Breathing and Patient connected to supplemental oxygen  Post-op Assessment: Post-op Vital signs reviewed, Patient's Cardiovascular Status Stable, Respiratory Function Stable, Patent Airway and No signs of Nausea or vomiting  Post-op Vital Signs: Reviewed and stable  Complications: No apparent anesthesia complications

## 2015-08-14 NOTE — H&P (Signed)
  Lucilla Lame, MD [facility] [address], [place], [place] [address] Phone: [phone] Fax : [phone]  Primary Care Physician:  Lelon Huh, MD Primary Gastroenterologist:  Dr. Allen Norris  Pre-Procedure History & Physical: HPI:  Marcus Gordon is a 30 y.o. male is here for an colonoscopy.   Past Medical History  Diagnosis Date  . Crohn's disease ([place])   . GERD (gastroesophageal reflux disease)   . External hemorrhoids   . Asthma   . Arthritis     hips and legs    Past Surgical History  Procedure Laterality Date  . Colonoscopy      Prior to Admission medications   Medication Sig Start Date End Date Taking? Authorizing Provider  Adalimumab (HUMIRA PEN) 40 MG/0.8ML PNKT Inject into the skin.   Yes Historical Provider, MD    Allergies as of 07/15/2015 - Review Complete 02/19/2015  Allergen Reaction Noted  . Aspirin  02/06/2015  . Morphine and related  02/06/2015  . Other  02/06/2015  . Remicade [infliximab]  02/06/2015  . Dilaudid [hydromorphone hcl] Rash 02/06/2015    Family History  Problem Relation Age of Onset  . Healthy Mother   . Healthy Father   . Healthy Brother     Social History   Social History  . Marital Status: Single    Spouse Name: N/A  . Number of Children: N/A  . Years of Education: N/A   Occupational History  . Not on file.   Social History Main Topics  . Smoking status: Current Every Day Smoker -- 1.00 packs/day for 13 years    Types: Cigarettes  . Smokeless tobacco: Never Used  . Alcohol Use: Yes     Comment: none in over 20 days  . Drug Use: No  . Sexual Activity: Not on file   Other Topics Concern  . Not on file   Social History Narrative    Review of Systems: See HPI, otherwise negative ROS  Physical Exam: BP 111/68 mmHg  Pulse 63  Temp(Src) 97.9 F (36.6 C) (Temporal)  Resp 18  Ht 6' (1.829 m)  Wt 149 lb (67.586 kg)  BMI 20.20 kg/m2  SpO2 99% General:   Alert,  pleasant and cooperative in NAD Head:   Normocephalic and atraumatic. Neck:  Supple; no masses or thyromegaly. Lungs:  Clear throughout to auscultation.    Heart:  Regular rate and rhythm. Abdomen:  Soft, nontender and nondistended. Normal bowel sounds, without guarding, and without rebound.   Neurologic:  Alert and  oriented x4;  grossly normal neurologically.  Impression/Plan: Marcus Gordon is here for an colonoscopy to be performed for Crohn's  Risks, benefits, limitations, and alternatives regarding  colonoscopy have been reviewed with the patient.  Questions have been answered.  All parties agreeable.   Lucilla Lame, MD  08/14/2015, 8:12 AM

## 2015-08-14 NOTE — Anesthesia Preprocedure Evaluation (Signed)
Anesthesia Evaluation  Patient identified by MRN, date of birth, ID band Patient awake    Reviewed: Allergy & Precautions, H&P , NPO status , Patient's Chart, lab work & pertinent test results  History of Anesthesia Complications (+) MALIGNANT HYPERTHERMIA  Airway Mallampati: I  TM Distance: >3 FB Neck ROM: full    Dental no notable dental hx.    Pulmonary asthma , Current Smoker,    Pulmonary exam normal        Cardiovascular negative cardio ROS Normal cardiovascular exam     Neuro/Psych    GI/Hepatic Neg liver ROS, Medicated,UC   Endo/Other    Renal/GU negative Renal ROS     Musculoskeletal   Abdominal   Peds  Hematology negative hematology ROS (+)   Anesthesia Other Findings   Reproductive/Obstetrics                             Anesthesia Physical Anesthesia Plan  ASA: II  Anesthesia Plan: MAC   Post-op Pain Management:    Induction:   Airway Management Planned:   Additional Equipment:   Intra-op Plan:   Post-operative Plan:   Informed Consent: I have reviewed the patients History and Physical, chart, labs and discussed the procedure including the risks, benefits and alternatives for the proposed anesthesia with the patient or authorized representative who has indicated his/her understanding and acceptance.     Plan Discussed with: CRNA  Anesthesia Plan Comments:         Anesthesia Quick Evaluation

## 2015-08-14 NOTE — Anesthesia Procedure Notes (Signed)
Procedure Name: MAC Performed by: Castulo Scarpelli Pre-anesthesia Checklist: Patient identified, Emergency Drugs available, Suction available, Patient being monitored and Timeout performed Patient Re-evaluated:Patient Re-evaluated prior to inductionOxygen Delivery Method: Nasal cannula       

## 2015-08-14 NOTE — Op Note (Signed)
[facility] Gastroenterology Patient Name: Marcus Gordon Procedure Date: 08/14/2015 8:48 AM MRN: [id number] Account #: [id number] Date of Birth: 12-01-1985 Admit Type: Outpatient Age: 30 Room: [facility] OR ROOM 01 Gender: Male Note Status: Finalized Procedure:            Colonoscopy Indications:          Personal history of Crohn's disease Providers:            Lucilla Lame, MD Referring MD:         Rose Fillers. Jaynie Crumble, MD (Referring MD) Medicines:            Propofol per Anesthesia Complications:        No immediate complications. Procedure:            Pre-Anesthesia Assessment:                       - Prior to the procedure, a History and Physical was                        performed, and patient medications and allergies were                        reviewed. The patient's tolerance of previous                        anesthesia was also reviewed. The risks and benefits of                        the procedure and the sedation options and risks were                        discussed with the patient. All questions were                        answered, and informed consent was obtained. Prior                        Anticoagulants: The patient has taken no previous                        anticoagulant or antiplatelet agents. ASA Grade                        Assessment: II - A patient with mild systemic disease.                        After reviewing the risks and benefits, the patient was                        deemed in satisfactory condition to undergo the                        procedure.                       After obtaining informed consent, the colonoscope was                        passed under direct vision. Throughout the procedure,  the patient's blood pressure, pulse, and oxygen                        saturations were monitored continuously. The Olympus                        CF-HQ190L Colonoscope (S#. [id number]) was introduced          through the anus and advanced to the the terminal                        ileum. The colonoscopy was performed without                        difficulty. The patient tolerated the procedure well.                        The quality of the bowel preparation was good. Findings:      A 4 mm polyp was found in the descending colon. The polyp was sessile.       The polyp was removed with a cold biopsy forceps. Resection and       retrieval were complete.      The terminal ileum contained a few two mm ulcers. No bleeding was       present. Biopsies were taken with a cold forceps for histology.      Discontinuous areas of nonbleeding ulcerated mucosa with no stigmata of       recent bleeding were present at the hepatic flexure. Biopsies were taken       with a cold forceps for histology.      The digital rectal exam findings include anal fissure.      Non-bleeding internal hemorrhoids were found during retroflexion. The       hemorrhoids were Grade II (internal hemorrhoids that prolapse but reduce       spontaneously). Impression:           - One 4 mm polyp in the descending colon, removed with                        a cold biopsy forceps. Resected and retrieved.                       - A few ulcers in the terminal ileum. Biopsied.                       - Mucosal ulceration. Biopsied.                       - Anal fissure found on digital rectal exam.                       - Non-bleeding internal hemorrhoids. Recommendation:       - Await pathology results. Procedure Code(s):    --- Professional ---                       [phone], Colonoscopy, flexible; with biopsy, single or                        multiple Diagnosis Code(s):    --- Professional ---  Z87.19, Personal history of other diseases of the                        digestive system                       K63.3, Ulcer of intestine                       K60.2, Anal fissure, unspecified                       D12.4,  Benign neoplasm of descending colon CPT copyright 2016 American Medical Association. All rights reserved. The codes documented in this report are preliminary and upon coder review may  be revised to meet current compliance requirements. Lucilla Lame, MD 08/14/2015 9:12:33 AM This report has been signed electronically. Number of Addenda: 0 Note Initiated On: 08/14/2015 8:48 AM Scope Withdrawal Time: 0 hours 8 minutes 14 seconds  Total Procedure Duration: 0 hours 13 minutes 45 seconds       [facility]

## 2015-08-17 ENCOUNTER — Encounter: Payer: Self-pay | Admitting: Gastroenterology

## 2015-08-18 ENCOUNTER — Encounter: Payer: Self-pay | Admitting: Gastroenterology

## 2015-09-04 ENCOUNTER — Ambulatory Visit: Payer: Medicare Other | Attending: Otolaryngology | Admitting: Speech Pathology

## 2015-09-04 ENCOUNTER — Encounter: Payer: Self-pay | Admitting: Speech Pathology

## 2015-09-04 DIAGNOSIS — R490 Dysphonia: Secondary | ICD-10-CM | POA: Insufficient documentation

## 2015-09-04 NOTE — Therapy (Signed)
[place] MAIN [facility] SERVICES [place], [place], [address] Phone: [phone]   Fax:  [phone]  Speech Language Pathology Evaluation  Patient Details  Name: Marcus Gordon MRN: [id number] Date of Birth: 1985/05/23 Referring Provider: Dr. Pryor Ochoa  Encounter Date: 09/04/2015      End of Session - 09/04/15 1132    Visit Number 1   Number of Visits 17   Date for SLP Re-Evaluation 11/04/15   SLP Start Time 1000   SLP Stop Time  42   SLP Time Calculation (min) 30 min   Activity Tolerance Patient tolerated treatment well      Past Medical History  Diagnosis Date   Crohn's disease ([place])    GERD (gastroesophageal reflux disease)    External hemorrhoids    Asthma    Arthritis     hips and legs    Past Surgical History  Procedure Laterality Date   Colonoscopy     Colonoscopy with propofol N/A 08/14/2015    Procedure: COLONOSCOPY WITH PROPOFOL;  Surgeon: Lucilla Lame, MD;  Location: [place];  Service: Endoscopy;  Laterality: N/A;   Polypectomy  08/14/2015    Procedure: POLYPECTOMY;  Surgeon: Lucilla Lame, MD;  Location: [place];  Service: Endoscopy;;    There were no vitals filed for this visit.      Subjective Assessment - 09/04/15 1130    Subjective  He reports that his voice is worse in the morning and evening, and that he has found no way to improve it. He is very frustrated with his voice.   Currently in Pain? No/denies            SLP Evaluation [facility] - 09/04/15 0001    SLP Visit Information   SLP Received On 09/04/15   Referring Provider Dr. Pryor Ochoa   Onset Date ~6/16   Subjective   Subjective This 30 year old male presents with a hoarse, inconsistent voice which began suddenly following a tonsillectomy 1 year ago. He reports that his voice is worse in the morning and evening, and that he has found no way to improve it. He does not report any pain.   Prior Functional Status   Cognitive/Linguistic Baseline Within functional limits   Oral Motor/Sensory Function   Overall Oral Motor/Sensory Function Appears within functional limits for tasks assessed   Motor Speech   Overall Motor Speech Appears within functional limits for tasks assessed            Perceptual Voice Evaluation Voice checklist:  Health risks: Smokes tobacco  pack/day; GERD  Characteristic voice use: Nurse, mental health risks: Personal stress; smoke in home  Misuse: N/A  Abuse: Muscle tension  Vocal characteristics: Hoarseness, strain, limited volume, sporadic voicing Patient Quality of Life Survey: Voice Handicap Index-10 Score of 25  A score of 10 or higher indicates perceived handicap Maximum phonation time for sustained ah: 2 seconds (air flow remains but voicing does not continue) Average fundamental frequency during sustained ah: 111.6 Hz (1.5 STD below average for age and gender) Average time patient was able to sustain /s/: 11.6 seconds Average time patient was able to sustain /z/: 3.6 seconds s/z ratio: 3.2/1  Pitch: limited pitch range observed during conversation Visi-Pitch: Multi-Dimensional Voice Program (MDVP)  MDVP extracts objective quantitative values (Relative Average Perturbation, Shimmer, Voice Turbulence Index, and Noise to Harmonic Ratio) on sustained phonation, which are displayed graphically and numerically in comparison to a built-in normative database.  The patient  exhibited values outside the norm for Relative Average Perturbation and Shimmer.  Average fundamental frequency was 1.5 STD below the average for age and gender. Stimulability: Patient was able to audibly improve vocal quality with instructions for appropriate breath support combined with straw phonation or lip trills.              SLP Education - 09/04/15 1132    Education provided Yes   Education Details Role of SLP in dysphonia Tx   Person(s) Educated Patient   Methods  Explanation   Comprehension Verbalized understanding            SLP Long Term Goals - 09/04/15 1134    SLP LONG TERM GOAL #1   Title The patient will demonstrate independent understanding of vocal hygiene concepts and neck, shoulder, lingual stretching exercises.   Time 8   Period Weeks   SLP LONG TERM GOAL #2   Title The patient will be independent for abdominal breathing and breath support exercises.   Time 8   Period Weeks   Status New   SLP LONG TERM GOAL #3   Title The patient will minimize vocal tension via Yawn-Sigh approach (or comparable technique) with min SLP cues with 80% accuracy.   Time 8   Period Weeks   Status New   SLP LONG TERM GOAL #4   Title The patient will maintain relaxed phonation / oral resonance for paragraph length recitation with 80% accuracy.   Time 8   Period Weeks   Status New          Plan - 09/04/15 1133    Clinical Impression Statement CLINICAL IMPRESSIONS: This 30 year old man under the care of Dr. Pryor Ochoa, with hoarse voice, is presenting with moderate-severe dysphonia.  The patient demonstrates hoarse vocal quality, reduced breath control for speech, strained/tense phonation, limited pitch range, and laryngeal tension. He will benefit from voice therapy for education, to improve breath support, improve tone focus, promote easy flow phonation, and learn techniques to increase loudness and pitch range without strain or pain.   Speech Therapy Frequency 2x / week   Duration Other (comment)  8 weeks   Treatment/Interventions Other (comment);Patient/family education;Functional tasks  Voice   Potential to Achieve Goals Good   Potential Considerations Ability to learn/carryover information;Family/community support;Co-morbidities;Cooperation/participation level;Medical prognosis;Pain level;Previous level of function;Severity of impairments;Financial resources   SLP Home Exercise Plan Breathing exercises; relaxed phonation   Consulted and Agree  with Plan of Care Patient      Patient will benefit from skilled therapeutic intervention in order to improve the following deficits and impairments:   Dysphonia    Problem List Patient Active Problem List   Diagnosis Date Noted   Personal history of digestive disease    Ulceration of intestine    Anal fissure    Benign neoplasm of descending colon    Rectal bleeding 07/06/2015   Arthritis 02/19/2015   Crohn's disease of colon ([place]) 02/19/2015   Infection with methicillin-resistant Staphylococcus aureus 02/19/2015   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

## 2015-09-11 ENCOUNTER — Ambulatory Visit: Payer: Medicare Other | Attending: Otolaryngology | Admitting: Speech Pathology

## 2015-09-11 DIAGNOSIS — R490 Dysphonia: Secondary | ICD-10-CM | POA: Insufficient documentation

## 2015-09-17 DIAGNOSIS — Z79899 Other long term (current) drug therapy: Secondary | ICD-10-CM | POA: Diagnosis not present

## 2015-09-17 DIAGNOSIS — L700 Acne vulgaris: Secondary | ICD-10-CM | POA: Diagnosis not present

## 2015-09-18 ENCOUNTER — Ambulatory Visit: Payer: Medicare Other | Admitting: Speech Pathology

## 2015-09-18 ENCOUNTER — Encounter: Payer: Self-pay | Admitting: Speech Pathology

## 2015-09-18 DIAGNOSIS — R490 Dysphonia: Secondary | ICD-10-CM | POA: Diagnosis not present

## 2015-09-18 NOTE — Therapy (Signed)
[place] MAIN [facility] SERVICES [address] [place], [place], [address] Phone: [phone]   Fax:  [phone]  Speech Language Pathology Treatment  Patient Details  Name: Marcus Gordon MRN: [id number] Date of Birth: November 21, 1985 Referring Provider: Dr. Pryor Ochoa  Encounter Date: 09/18/2015      End of Session - 09/18/15 1556    Visit Number 2   Number of Visits 17   Date for SLP Re-Evaluation 11/04/15      Past Medical History  Diagnosis Date  . Crohn's disease ([place])   . GERD (gastroesophageal reflux disease)   . External hemorrhoids   . Asthma   . Arthritis     hips and legs    Past Surgical History  Procedure Laterality Date  . Colonoscopy    . Colonoscopy with propofol N/A 08/14/2015    Procedure: COLONOSCOPY WITH PROPOFOL;  Surgeon: Lucilla Lame, MD;  Location: [place];  Service: Endoscopy;  Laterality: N/A;  . Polypectomy  08/14/2015    Procedure: POLYPECTOMY;  Surgeon: Lucilla Lame, MD;  Location: [place];  Service: Endoscopy;;    There were no vitals filed for this visit.      Subjective Assessment - 09/18/15 1555    Subjective Marcus Gordon was alert and engaged throughout the session. 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  He says that he feels like he is already being very loud in his everyday conversation, but people aren't able to hear him.   Currently in Pain? No/denies               ADULT SLP TREATMENT - 09/18/15 0001    General Information   Behavior/Cognition Alert;Cooperative   Treatment Provided   Treatment provided Cognitive-Linquistic   Pain Assessment   Pain Assessment No/denies pain   Cognitive-Linquistic Treatment   Treatment focused on Voice   Skilled Treatment Marcus Gordon was provided with written and verbal instruction regarding neck and shoulder stretches, breathing exercises, and flow  phonation exercises. He completed lip trills with and without phonation as well as humming with good vocal quality. He was able to produce hum+ vowel with good vocal quality 70% of the time. His voice loses quality with increased volume. He maintained good vocal quality at the word level 60% of the time. He produced pitch glides, both up and down, with good quality 80% of the time.   Assessment / Recommendations / Plan   Plan Continue with current plan of care   Progression Toward Goals   Progression toward goals Progressing toward goals          SLP Education - 09/18/15 1556    Education provided Yes   Education Details Breathing exercises; neck stretches; relaxed phonation   Person(s) Educated Patient   Methods Explanation;Demonstration   Comprehension Verbalized understanding;Returned demonstration            SLP Long Term Goals - 09/04/15 1134    SLP LONG TERM GOAL #1   Title The patient will demonstrate independent understanding of vocal hygiene concepts and neck, shoulder, lingual stretching exercises.   Time 8   Period Weeks   SLP LONG TERM GOAL #2   Title The patient will be independent for abdominal breathing and breath support exercises.   Time 8   Period Weeks   Status New   SLP LONG TERM GOAL #3   Title The  patient will minimize vocal tension via Yawn-Sigh approach (or comparable technique) with min SLP cues with 80% accuracy.   Time 8   Period Weeks   Status New   SLP LONG TERM GOAL #4   Title The patient will maintain relaxed phonation / oral resonance for paragraph length recitation with 80% accuracy.   Time 8   Period Weeks   Status New          Plan - 09/18/15 1556    Clinical Impression Statement Marcus Gordon is able to self-monitor the quality of his voice well, but not the volume. He will benefit from skilled intervention to improve his vocal quality and volume through improved breath support and reduced laryngeal tension.   Speech Therapy Frequency  2x / week   Duration Other (comment)   Treatment/Interventions Other (comment);Patient/family education;Functional tasks   Potential to Achieve Goals Good   Potential Considerations Ability to learn/carryover information;Family/community support;Co-morbidities;Cooperation/participation level;Medical prognosis;Pain level;Previous level of function;Severity of impairments;Financial resources   SLP Home Exercise Plan Breathing exercises; relaxed phonation   Consulted and Agree with Plan of Care Patient      Patient will benefit from skilled therapeutic intervention in order to improve the following deficits and impairments:   Dysphonia    Problem List Patient Active Problem List   Diagnosis Date Noted  . Personal history of digestive disease   . Ulceration of intestine   . Anal fissure   . Benign neoplasm of descending colon   . Rectal bleeding 07/06/2015  . Arthritis 02/19/2015  . Crohn's disease of colon ([place]) 02/19/2015  . Infection with methicillin-resistant Staphylococcus aureus 02/19/2015  . 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 GERD (gastroesophageal reflux disease) 02/06/2015    Marcus Gordon 09/18/2015, 3:56 PM  [place] MAIN [facility] SERVICES [address] [place], [place], [address] Phone: [phone]   Fax:  [phone]   Name: Marcus Gordon MRN: [id number] Date of Birth: 1986/02/14

## 2015-09-22 ENCOUNTER — Other Ambulatory Visit: Payer: Self-pay | Admitting: Family Medicine

## 2015-09-22 ENCOUNTER — Ambulatory Visit (INDEPENDENT_AMBULATORY_CARE_PROVIDER_SITE_OTHER): Payer: Medicare Other | Admitting: Family Medicine

## 2015-09-22 VITALS — BP 104/66 | HR 68 | Temp 98.3°F | Resp 16 | Wt 156.0 lb

## 2015-09-22 DIAGNOSIS — Z113 Encounter for screening for infections with a predominantly sexual mode of transmission: Secondary | ICD-10-CM | POA: Diagnosis not present

## 2015-09-22 DIAGNOSIS — Z202 Contact with and (suspected) exposure to infections with a predominantly sexual mode of transmission: Secondary | ICD-10-CM | POA: Diagnosis not present

## 2015-09-22 DIAGNOSIS — N4889 Other specified disorders of penis: Secondary | ICD-10-CM | POA: Diagnosis not present

## 2015-09-22 LAB — POCT URINALYSIS DIPSTICK
BILIRUBIN UA: NEGATIVE
GLUCOSE UA: NEGATIVE
KETONES UA: NEGATIVE
LEUKOCYTES UA: NEGATIVE
NITRITE UA: NEGATIVE
Protein, UA: NEGATIVE
Spec Grav, UA: >=1.03
Urobilinogen, UA: 0.2
pH, UA: 6

## 2015-09-22 NOTE — Progress Notes (Signed)
Subjective:     Patient ID: Marcus Gordon, male   DOB: 1985-07-01, 30 y.o.   MRN: [id number]  HPI States he has noticed penis pain with erections. Denies dysuria or discharge. Has had unprotected sex with two different partners. Also has been on oral isotretinoin for a month and is concerned that may be giving him side effects.   Review of Systems     Objective:   Physical Exam  Constitutional: He appears well-developed and well-nourished. No distress.  Skin:  Penis is sem-erect with no discharge, rash or lesions noted. Patient reports tenderness about mid-shaft.       Assessment:    1. Penis pain - POCT urinalysis dipstick - GC/chlamydia probe amp, urine  2. Screen for STD (sexually transmitted disease - GC/chlamydia probe amp, urine - RPR - HSV(herpes simplex vrs) 1+2 ab-IgG - HIV antibody  3. Possible exposure to STD    Plan:    Further f/u pending lab work. If labs ok may wish to discuss coming off medication with dermatology.

## 2015-09-22 NOTE — Patient Instructions (Signed)
We will call you with the lab results. If labs are ok, call Dr.Kowalski and see about coming off medication for a while.

## 2015-09-23 ENCOUNTER — Encounter: Payer: Self-pay | Admitting: Family Medicine

## 2015-09-23 DIAGNOSIS — B009 Herpesviral infection, unspecified: Secondary | ICD-10-CM | POA: Insufficient documentation

## 2015-09-23 LAB — RPR: RPR: NONREACTIVE

## 2015-09-23 LAB — HSV(HERPES SIMPLEX VRS) I + II AB-IGG
HSV 1 GLYCOPROTEIN G AB, IGG: 15.7 {index} — AB (ref 0.00–0.90)
HSV 2 Glycoprotein G Ab, IgG: <0.91 index (ref 0.00–0.90)

## 2015-09-23 LAB — HIV ANTIBODY (ROUTINE TESTING W REFLEX): HIV SCREEN 4TH GENERATION: NONREACTIVE

## 2015-09-23 LAB — PLEASE NOTE

## 2015-09-24 DIAGNOSIS — R490 Dysphonia: Secondary | ICD-10-CM | POA: Diagnosis not present

## 2015-09-25 ENCOUNTER — Ambulatory Visit: Payer: Medicare Other | Admitting: Speech Pathology

## 2015-09-26 LAB — GC/CHLAMYDIA PROBE AMP
CHLAMYDIA, DNA PROBE: NEGATIVE
NEISSERIA GONORRHOEAE BY PCR: NEGATIVE

## 2015-09-26 LAB — SPECIMEN STATUS REPORT

## 2015-09-29 ENCOUNTER — Telehealth: Payer: Self-pay | Admitting: Family Medicine

## 2015-09-29 NOTE — Telephone Encounter (Signed)
Please check with [facility] and see if this lab is ready.

## 2015-09-29 NOTE — Telephone Encounter (Signed)
Pt called to see if his results from 09/22/15 were in. Pt stated that he was advised of some results on 09/24/15 but he stated there was a urine result he was still waiting to hear about. It looks like we were still waiting for the gonorrhea results. Pt would like to be advised asap. Thanks TNP

## 2015-10-01 NOTE — Telephone Encounter (Signed)
Patient has been advised of G/C screening, he has declined seeing Urologist at this time, stating that he stopped medication prescribed by dermatologist and penile pain stopped shortly after he d/c medication. KW

## 2015-10-02 ENCOUNTER — Ambulatory Visit: Payer: Medicare Other | Admitting: Speech Pathology

## 2015-10-02 ENCOUNTER — Encounter: Payer: Self-pay | Admitting: Speech Pathology

## 2015-10-02 DIAGNOSIS — R490 Dysphonia: Secondary | ICD-10-CM | POA: Diagnosis not present

## 2015-10-02 NOTE — Therapy (Signed)
[place] MAIN [facility] SERVICES [address] [place], [place], [address] Phone: [phone]   Fax:  [phone]  Speech Language Pathology Treatment  Patient Details  Name: Marcus Gordon MRN: [id number] Date of Birth: 15-Feb-1986 Referring Provider: Dr. Pryor Ochoa  Encounter Date: 10/02/2015      End of Session - 10/02/15 1506    Visit Number 3   Number of Visits 17   Date for SLP Re-Evaluation 11/04/15   SLP Start Time 1405   SLP Stop Time  1450   SLP Time Calculation (min) 45 min   Activity Tolerance Patient tolerated treatment well      Past Medical History:  Diagnosis Date  . Arthritis    hips and legs  . Asthma   . Crohn's disease ([place])   . External hemorrhoids   . GERD (gastroesophageal reflux disease)     Past Surgical History:  Procedure Laterality Date  . COLONOSCOPY    . COLONOSCOPY WITH PROPOFOL N/A 08/14/2015   Procedure: COLONOSCOPY WITH PROPOFOL;  Surgeon: Lucilla Lame, MD;  Location: [place];  Service: Endoscopy;  Laterality: N/A;  . POLYPECTOMY  08/14/2015   Procedure: POLYPECTOMY;  Surgeon: Lucilla Lame, MD;  Location: [place];  Service: Endoscopy;;    There were no vitals filed for this visit.      Subjective Assessment - 10/02/15 1503    Subjective Marcus Gordon was alert and engaged during therapy session. Pt reports that he fills his voice has improved and that he saw his doctor last week and that the doctor encouraged him to keep coming to therapy.    Currently in Pain? No/denies               ADULT SLP TREATMENT - 10/02/15 0001      General Information   Behavior/Cognition Alert;Cooperative     Treatment Provided   Treatment provided Cognitive-Linquistic     Pain Assessment   Pain Assessment No/denies pain     Cognitive-Linquistic Treatment   Treatment focused on Voice   Skilled Treatment Marcus Gordon reported he has not been practicing at home. Marcus Gordon completed neck  stretches w/min cues. Practiced abdominal breathing for ~2 minutes and breaths support exercises x10 each. He was able to complete lip trills with and without phonation as well as humming with good vocal quality. Pt was able to produce hum + vowel with good vocal quality 90% of the time. Marcus Gordon stated /h/ initial and vowel initial words with 80% acc to increase airflow. Attempted phrases, however vocal quality decreased with increased length of utterance.      Assessment / Recommendations / Plan   Plan Continue with current plan of care     Progression Toward Goals   Progression toward goals Progressing toward goals          SLP Education - 10/02/15 1505    Education provided Yes   Education Details Breathing exercises; neck stretches and relaxed phonation   Person(s) Educated Patient   Methods Explanation;Demonstration   Comprehension Verbalized understanding;Returned demonstration            SLP Long Term Goals - 09/04/15 1134      SLP LONG TERM GOAL #1   Title The patient will demonstrate independent understanding of vocal hygiene concepts and neck, shoulder, lingual stretching exercises.   Time 8   Period Weeks     SLP LONG TERM GOAL #2   Title The patient will be independent for  abdominal breathing and breath support exercises.   Time 8   Period Weeks   Status New     SLP LONG TERM GOAL #3   Title The patient will minimize vocal tension via Yawn-Sigh approach (or comparable technique) with min SLP cues with 80% accuracy.   Time 8   Period Weeks   Status New     SLP LONG TERM GOAL #4   Title The patient will maintain relaxed phonation / oral resonance for paragraph length recitation with 80% accuracy.   Time 8   Period Weeks   Status New          Plan - 10/02/15 1506    Clinical Impression Statement Marcus Gordon is able to self-monitor the quality of his voice well and today pt was speaking at a good volume in therapy. Pt still demonstrated intermittent  breaks in voice, but was able to revise voice quickly. He will benefit from skilled intervention to improve his vocal quality and volume through improved breath support and reduced laryngeal tension.   Speech Therapy Frequency 2x / week   Duration Other (comment)  6 weeks   Treatment/Interventions Other (comment);Patient/family education;Functional tasks   Potential to Achieve Goals Good   Potential Considerations Ability to learn/carryover information;Family/community support;Co-morbidities;Cooperation/participation level;Medical prognosis;Pain level;Previous level of function;Severity of impairments;Financial resources   SLP Home Exercise Plan Breathing exercises; relaxed phonation   Consulted and Agree with Plan of Care Patient      Patient will benefit from skilled therapeutic intervention in order to improve the following deficits and impairments:   Dysphonia    Problem List Patient Active Problem List   Diagnosis Date Noted  . 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 Personal history of digestive disease   . Ulceration of intestine   . Anal fissure   . Benign neoplasm of descending colon   . Arthritis 02/19/2015  . Crohn's disease of colon ([place]) 02/19/2015  . Infection with methicillin-resistant Staphylococcus aureus 02/19/2015  . 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 GERD (gastroesophageal reflux disease) 02/06/2015    Marcus Gordon,Marcus Gordon 10/02/2015, 3:12 PM  [place] MAIN [facility] SERVICES [address] [place], [place], [address] Phone: [phone]   Fax:  [phone]   Name: Marcus Gordon MRN: [id number] Date of Birth: [birth year]

## 2015-10-19 DIAGNOSIS — L738 Other specified follicular disorders: Secondary | ICD-10-CM | POA: Diagnosis not present

## 2015-10-19 DIAGNOSIS — L732 Hidradenitis suppurativa: Secondary | ICD-10-CM | POA: Diagnosis not present

## 2015-10-19 DIAGNOSIS — Z79899 Other long term (current) drug therapy: Secondary | ICD-10-CM | POA: Diagnosis not present

## 2015-10-19 DIAGNOSIS — L700 Acne vulgaris: Secondary | ICD-10-CM | POA: Diagnosis not present

## 2015-10-23 ENCOUNTER — Ambulatory Visit: Payer: Medicare Other | Admitting: Speech Pathology

## 2015-10-30 ENCOUNTER — Ambulatory Visit: Payer: Medicare Other | Attending: Otolaryngology | Admitting: Speech Pathology

## 2015-11-06 ENCOUNTER — Ambulatory Visit: Payer: Medicare Other | Admitting: Speech Pathology

## 2015-11-13 ENCOUNTER — Ambulatory Visit: Payer: Medicare Other | Admitting: Speech Pathology

## 2015-11-23 DIAGNOSIS — Z79899 Other long term (current) drug therapy: Secondary | ICD-10-CM | POA: Diagnosis not present

## 2015-11-23 DIAGNOSIS — L700 Acne vulgaris: Secondary | ICD-10-CM | POA: Diagnosis not present

## 2015-11-23 DIAGNOSIS — L732 Hidradenitis suppurativa: Secondary | ICD-10-CM | POA: Diagnosis not present

## 2015-11-23 DIAGNOSIS — L280 Lichen simplex chronicus: Secondary | ICD-10-CM | POA: Diagnosis not present

## 2015-11-27 DIAGNOSIS — K50011 Crohn's disease of small intestine with rectal bleeding: Secondary | ICD-10-CM | POA: Diagnosis not present

## 2015-11-27 DIAGNOSIS — M4690 Unspecified inflammatory spondylopathy, site unspecified: Secondary | ICD-10-CM | POA: Diagnosis not present

## 2016-04-21 DIAGNOSIS — Z8719 Personal history of other diseases of the digestive system: Secondary | ICD-10-CM | POA: Diagnosis not present

## 2016-04-21 DIAGNOSIS — Z8739 Personal history of other diseases of the musculoskeletal system and connective tissue: Secondary | ICD-10-CM | POA: Diagnosis not present

## 2016-04-21 DIAGNOSIS — L732 Hidradenitis suppurativa: Secondary | ICD-10-CM | POA: Diagnosis not present

## 2016-04-21 DIAGNOSIS — A630 Anogenital (venereal) warts: Secondary | ICD-10-CM | POA: Diagnosis not present

## 2016-04-21 DIAGNOSIS — Z79899 Other long term (current) drug therapy: Secondary | ICD-10-CM | POA: Diagnosis not present

## 2016-06-07 DIAGNOSIS — M4690 Unspecified inflammatory spondylopathy, site unspecified: Secondary | ICD-10-CM | POA: Diagnosis not present

## 2016-06-07 DIAGNOSIS — K50011 Crohn's disease of small intestine with rectal bleeding: Secondary | ICD-10-CM | POA: Diagnosis not present

## 2016-06-07 DIAGNOSIS — Z79899 Other long term (current) drug therapy: Secondary | ICD-10-CM | POA: Diagnosis not present

## 2016-10-02 ENCOUNTER — Encounter: Payer: Self-pay | Admitting: Emergency Medicine

## 2016-10-02 ENCOUNTER — Emergency Department
Admission: EM | Admit: 2016-10-02 | Discharge: 2016-10-02 | Disposition: A | Discharge status: Home / Self Care | Payer: Medicare Other | Attending: Emergency Medicine | Admitting: Emergency Medicine

## 2016-10-02 DIAGNOSIS — T63441A Toxic effect of venom of bees, accidental (unintentional), initial encounter: Secondary | ICD-10-CM | POA: Insufficient documentation

## 2016-10-02 DIAGNOSIS — K5090 Crohn's disease, unspecified, without complications: Secondary | ICD-10-CM | POA: Diagnosis not present

## 2016-10-02 DIAGNOSIS — F17210 Nicotine dependence, cigarettes, uncomplicated: Secondary | ICD-10-CM | POA: Diagnosis not present

## 2016-10-02 DIAGNOSIS — Z79899 Other long term (current) drug therapy: Secondary | ICD-10-CM | POA: Insufficient documentation

## 2016-10-02 DIAGNOSIS — J45909 Unspecified asthma, uncomplicated: Secondary | ICD-10-CM | POA: Insufficient documentation

## 2016-10-02 MED ORDER — PREDNISONE 50 MG PO TABS: 50.0000 mg | ORAL_TABLET | Freq: Every day | ORAL | 0 refills | Status: DC

## 2016-10-02 MED ORDER — DIPHENHYDRAMINE HCL 50 MG/ML IJ SOLN
50.0000 mg | Freq: Once | INTRAMUSCULAR | Status: AC
  Administered 2016-10-02: 50 mg via INTRAMUSCULAR
  Filled 2016-10-02: qty 1

## 2016-10-02 MED ORDER — DEXAMETHASONE SODIUM PHOSPHATE 10 MG/ML IJ SOLN
10.0000 mg | Freq: Once | INTRAMUSCULAR | Status: AC
  Administered 2016-10-02: 10 mg via INTRAMUSCULAR
  Filled 2016-10-02: qty 1

## 2016-10-02 NOTE — ED Triage Notes (Signed)
Patient was stung by multiple bees about 2 hours ago. Patient with bee sting to back of neck, right upper arm and legs. Patient states that he wanted to be seen to make sure it did not affect his chrons disease. Patient in no acute distress at this time.

## 2016-10-02 NOTE — ED Notes (Signed)
Patient reports multiple bee stings this afternoon to arms, neck, trunk and legs.  Patient reports aches all over and wants to make sure it will not make his crohn's or arthritis flare.  Patient with no respiratory distress noted.

## 2016-10-02 NOTE — ED Provider Notes (Signed)
[facility] Emergency Department Provider Note  ____________________________________________  Time seen: Approximately 8:46 PM  I have reviewed the triage vital signs and the nursing notes.   HISTORY  Chief Complaint Insect Bite    HPI Marcus Gordon is a 31 y.o. male who presents emergency department status post being stung multiple times by yellow jackets. Patient reports that he was fishing when he was stung multiple times after possibly disrupting her nose. Patient reports being stung multiple times on his feet, legs, hands, arms, posterior neck. Patient denies any difficulty breathing or shortness of breath. No history of allergic reactions to bee stings. No medications prior to arrival. Patient is mostly concerned as he is "afraid the stings may inflame my Crohn's.   Past Medical History:  Diagnosis Date  . Arthritis    hips and legs  . Asthma   . Crohn's disease ([place])   . External hemorrhoids   . GERD (gastroesophageal reflux disease)     Patient Active Problem List   Diagnosis Date Noted  . 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 Personal history of digestive disease   . Ulceration of intestine   . Anal fissure   . Benign neoplasm of descending colon   . Arthritis 02/19/2015  . Crohn's disease of colon ([place]) 02/19/2015  . Infection with methicillin-resistant Staphylococcus aureus 02/19/2015  . 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 GERD (gastroesophageal reflux disease) 02/06/2015    Past Surgical History:  Procedure Laterality Date  . COLONOSCOPY    . COLONOSCOPY WITH PROPOFOL N/A 08/14/2015   Procedure: COLONOSCOPY WITH PROPOFOL;  Surgeon: Lucilla Lame, MD;  Location: [place];  Service: Endoscopy;  Laterality: N/A;  . POLYPECTOMY  08/14/2015   Procedure: POLYPECTOMY;  Surgeon: Lucilla Lame, MD;  Location: [place];  Service: Endoscopy;;    Prior to Admission medications   Medication Sig Start  Date End Date Taking? Authorizing Provider  Adalimumab (HUMIRA PEN) 40 MG/0.8ML PNKT Inject into the skin.    [provider]  ketoconazole (NIZORAL) 2 % shampoo 1 APPLICATION ON SKIN AS DIRECTED.SHAMPOO FACE & SCALP 3 DAYS A WEEK. LET SIT SEVERAL MIN & RINSE 08/10/15   [provider]  mupirocin ointment (BACTROBAN) 2 % 1 APPLICATION APPLY ON THE SKIN AT BEDTIME APPLY TO INNER NOSE EVERY NIGHT UNTIL CLEAR. 08/10/15   [provider]  MYORISAN 20 MG capsule TAKE 1 CAPSULE BY MOUTH DAILY. ID [id number]. [place] [phone]. MUST PICK UP BY 09/15/2015 08/20/15   [provider]    Allergies Aspirin; Other; Remicade [infliximab]; Dilaudid [hydromorphone hcl]; and Morphine and related  Family History  Problem Relation Age of Onset  . Healthy Mother   . Healthy Father   . Healthy Brother     Social History Social History  Substance Use Topics  . Smoking status: Current Every Day Smoker    Packs/day: 1.00    Years: 13.00    Types: Cigarettes  . Smokeless tobacco: Never Used  . Alcohol use Yes     Comment: occ     Review of Systems  Constitutional: No fever/chills Eyes: No visual changes.  ENT: No upper respiratory complaints. Cardiovascular: no chest pain. Respiratory: no cough. No SOB. Gastrointestinal: No abdominal pain.  No nausea, no vomiting.   Musculoskeletal: Negative for musculoskeletal pain. Skin: Positive for multiple bee stings Neurological: Negative for headaches, focal weakness or numbness. 10-point ROS otherwise negative.  ____________________________________________   PHYSICAL EXAM:  VITAL SIGNS: ED Triage Vitals [  10/02/16 1933]  Enc Vitals Group     BP 106/74     Pulse Rate 71     Resp 18     Temp 97.9 F (36.6 C)     Temp Source Oral     SpO2 100 %     Weight 170 lb (77.1 kg)     Height 6' (1.829 m)     Head Circumference      Peak Flow      Pain Score 4     Pain Loc      Pain Edu?      Excl. in [place]?       Constitutional: Alert and oriented. Well appearing and in no acute distress. Eyes: Conjunctivae are normal. PERRL. EOMI. Head: Atraumatic. ENT:      Ears:       Nose: No congestion/rhinnorhea.      Mouth/Throat: Mucous membranes are moist. No angioedema. Neck: No stridor.    Cardiovascular: Normal rate, regular rhythm. Normal S1 and S2.  Good peripheral circulation. Respiratory: Normal respiratory effort without tachypnea or retractions. Lungs CTAB. Good air entry to the bases with no decreased or absent breath sounds. Musculoskeletal: Full range of motion to all extremities. No gross deformities appreciated. Neurologic:  Normal speech and language. No gross focal neurologic deficits are appreciated.  Skin:  Skin is warm, dry and intact. No rash noted. Multiple scattered erythematous lesions consistent with stings are noted to bilateral hands, forearms, feet, legs, posterior neck. No gross surrounding erythema or edema. No embedded stingers. Psychiatric: Mood and affect are normal. Speech and behavior are normal. Patient exhibits appropriate insight and judgement.   ____________________________________________   LABS (all labs ordered are listed, but only abnormal results are displayed)  Labs Reviewed - No data to display ____________________________________________  EKG   ____________________________________________  RADIOLOGY   No results found.  ____________________________________________    PROCEDURES  Procedure(s) performed:    Procedures    Medications - No data to display   ____________________________________________   INITIAL IMPRESSION / ASSESSMENT AND PLAN / ED COURSE  Pertinent labs & imaging results that were available during my care of the patient were reviewed by me and considered in my medical decision making (see chart for details).  Review of the [place] CSRS was performed in accordance of the [place] prior to dispensing any controlled  drugs.     Patient's diagnosis is consistent with multiple bee stings. No signs of acute allergic reaction. Patient will be provided with injections of steroids and diphenhydramine.. Patient will be discharged home with prescriptions for prednisone and take oral diphenhydramine at home. Patient is to follow up with primary care as needed or otherwise directed. Patient is given ED precautions to return to the ED for any worsening or new symptoms.     ____________________________________________  FINAL CLINICAL IMPRESSION(S) / ED DIAGNOSES  Final diagnoses:  None      NEW MEDICATIONS STARTED DURING THIS VISIT:  New Prescriptions   No medications on file        This chart was dictated using voice recognition software/Dragon. Despite best efforts to proofread, errors can occur which can change the meaning. Any change was purely unintentional.    Darletta Moll, PA-C 10/02/16 2054    Nance Pear, MD 10/02/16 2227

## 2016-11-23 ENCOUNTER — Ambulatory Visit: Payer: Medicare Other | Admitting: Family Medicine

## 2016-12-06 DIAGNOSIS — M4690 Unspecified inflammatory spondylopathy, site unspecified: Secondary | ICD-10-CM | POA: Diagnosis not present

## 2016-12-06 DIAGNOSIS — K50011 Crohn's disease of small intestine with rectal bleeding: Secondary | ICD-10-CM | POA: Diagnosis not present

## 2016-12-28 ENCOUNTER — Encounter: Payer: Self-pay | Admitting: Family Medicine

## 2016-12-28 ENCOUNTER — Ambulatory Visit (INDEPENDENT_AMBULATORY_CARE_PROVIDER_SITE_OTHER): Payer: Medicare Other | Admitting: Family Medicine

## 2016-12-28 VITALS — BP 110/70 | HR 73 | Temp 97.4°F | Wt 168.0 lb

## 2016-12-28 DIAGNOSIS — H109 Unspecified conjunctivitis: Secondary | ICD-10-CM

## 2016-12-28 MED ORDER — CIPROFLOXACIN HCL 0.3 % OP SOLN: 2.0000 [drp] | OPHTHALMIC | 0 refills | Status: DC

## 2016-12-28 NOTE — Progress Notes (Signed)
       Patient: Marcus Gordon Male    DOB: Feb 13, 1986   31 y.o.   MRN: [id number] Visit Date: 12/28/2016  Today's Provider: Lelon Huh, MD   Chief Complaint  Patient presents with  . Eye Drainage    x 2-3 weeks   Subjective:    Eye Problem   The left eye is affected. This is a new problem. Episode onset: 2-3 weeks. The problem occurs constantly. The problem has been unchanged. There was no injury mechanism. The patient is experiencing no pain. There is no known exposure to pink eye. He does not wear contacts. Associated symptoms include an eye discharge, eye redness and a foreign body sensation. Pertinent negatives include no blurred vision, double vision, fever, itching, nausea, photophobia or recent URI. Treatments tried: OTC Eye drops. The treatment provided mild relief.  Patient states he wakes up with his eye matted shut.       Allergies  Allergen Reactions  . Aspirin     GI upset  . Other     Other reaction(s): Other (See Comments) Anti-inflammatory-GI upset  . Remicade [Infliximab]     Gastrointestinal agents  . Dilaudid [Hydromorphone Hcl] Rash    Itching  . Morphine And Related Rash     Current Outpatient Prescriptions:  .  Adalimumab (HUMIRA PEN) 40 MG/0.8ML PNKT, Inject into the skin., Disp: , Rfl:  .  ketoconazole (NIZORAL) 2 % shampoo, 1 APPLICATION ON SKIN AS DIRECTED.SHAMPOO FACE & SCALP 3 DAYS A WEEK. LET SIT SEVERAL MIN & RINSE, Disp: , Rfl: 0  Review of Systems  Constitutional: Negative for fever.  Eyes: Positive for discharge and redness. Negative for blurred vision, double vision, photophobia and itching.  Gastrointestinal: Negative for nausea.    Social History  Substance Use Topics  . Smoking status: Current Every Day Smoker    Packs/day: 1.00    Years: 13.00    Types: Cigarettes  . Smokeless tobacco: Never Used  . Alcohol use Yes     Comment: occ   Objective:   BP 110/70 (BP Location: Left Arm, Patient Position: Sitting, Cuff Size:  Normal)   Pulse 73   Temp (!) 97.4 F (36.3 C) (Oral)   Wt 168 lb (76.2 kg)   SpO2 98% Comment: room air  BMI 22.78 kg/m  Vitals:   12/28/16 1009  Weight: 168 lb (76.2 kg)     Physical Exam  General Appearance:    Alert, cooperative, no distress  HENT:   throat normal without erythema or exudate  Eyes:   mildly injected left eye with scant yellow discharge. No foreign bodies noted.      Lungs:     Clear to auscultation bilaterally, respirations unlabored  Heart:    Regular rate and rhythm  Neurologic:   Awake, alert, oriented x 3. No apparent focal neurological           defect.           Assessment & Plan:     1. Conjunctivitis of left eye, unspecified conjunctivitis type  - ciprofloxacin (CILOXAN) 0.3 % ophthalmic solution; Place 2 drops into the left eye every 4 (four) hours while awake.  Dispense: 5 mL; Refill: 0  Call if symptoms change or if not rapidly improving.          Lelon Huh, MD  [facility]

## 2017-01-11 ENCOUNTER — Other Ambulatory Visit: Payer: Self-pay | Admitting: Family Medicine

## 2017-01-11 MED ORDER — OFLOXACIN 0.3 % OP SOLN: 1.0000 [drp] | Freq: Four times a day (QID) | OPHTHALMIC | 0 refills | Status: AC

## 2017-06-06 DIAGNOSIS — K50011 Crohn's disease of small intestine with rectal bleeding: Secondary | ICD-10-CM | POA: Diagnosis not present

## 2017-06-06 DIAGNOSIS — M4690 Unspecified inflammatory spondylopathy, site unspecified: Secondary | ICD-10-CM | POA: Diagnosis not present

## 2017-07-27 ENCOUNTER — Other Ambulatory Visit: Payer: Self-pay

## 2017-08-02 ENCOUNTER — Ambulatory Visit: Payer: Medicare Other | Admitting: Gastroenterology

## 2018-02-20 DIAGNOSIS — H10021 Other mucopurulent conjunctivitis, right eye: Secondary | ICD-10-CM | POA: Diagnosis not present

## 2018-02-23 DIAGNOSIS — H2000 Unspecified acute and subacute iridocyclitis: Secondary | ICD-10-CM | POA: Diagnosis not present

## 2018-03-02 DIAGNOSIS — H2000 Unspecified acute and subacute iridocyclitis: Secondary | ICD-10-CM | POA: Diagnosis not present

## 2018-03-30 DIAGNOSIS — H2000 Unspecified acute and subacute iridocyclitis: Secondary | ICD-10-CM | POA: Diagnosis not present

## 2018-04-02 DIAGNOSIS — H2000 Unspecified acute and subacute iridocyclitis: Secondary | ICD-10-CM | POA: Diagnosis not present

## 2018-05-08 DIAGNOSIS — H2000 Unspecified acute and subacute iridocyclitis: Secondary | ICD-10-CM | POA: Diagnosis not present

## 2018-08-02 DIAGNOSIS — A630 Anogenital (venereal) warts: Secondary | ICD-10-CM | POA: Diagnosis not present

## 2018-08-02 DIAGNOSIS — Z8719 Personal history of other diseases of the digestive system: Secondary | ICD-10-CM | POA: Diagnosis not present

## 2018-08-02 DIAGNOSIS — Z872 Personal history of diseases of the skin and subcutaneous tissue: Secondary | ICD-10-CM | POA: Diagnosis not present

## 2018-12-25 ENCOUNTER — Emergency Department
Admission: EM | Admit: 2018-12-25 | Discharge: 2018-12-25 | Disposition: A | Discharge status: Home / Self Care | Payer: Medicare Other | Attending: Emergency Medicine | Admitting: Emergency Medicine

## 2018-12-25 ENCOUNTER — Emergency Department: Payer: Medicare Other

## 2018-12-25 ENCOUNTER — Encounter: Payer: Self-pay | Admitting: Emergency Medicine

## 2018-12-25 ENCOUNTER — Other Ambulatory Visit: Payer: Self-pay

## 2018-12-25 DIAGNOSIS — R112 Nausea with vomiting, unspecified: Secondary | ICD-10-CM | POA: Diagnosis not present

## 2018-12-25 DIAGNOSIS — R519 Headache, unspecified: Secondary | ICD-10-CM | POA: Insufficient documentation

## 2018-12-25 DIAGNOSIS — D124 Benign neoplasm of descending colon: Secondary | ICD-10-CM | POA: Insufficient documentation

## 2018-12-25 DIAGNOSIS — J45909 Unspecified asthma, uncomplicated: Secondary | ICD-10-CM | POA: Diagnosis not present

## 2018-12-25 DIAGNOSIS — F17210 Nicotine dependence, cigarettes, uncomplicated: Secondary | ICD-10-CM | POA: Diagnosis not present

## 2018-12-25 LAB — CBC WITH DIFFERENTIAL/PLATELET
Abs Immature Granulocytes: 0.29 10*3/uL — ABNORMAL HIGH (ref 0.00–0.07)
Basophils Absolute: 0.1 10*3/uL (ref 0.0–0.1)
Basophils Relative: 0 %
Eosinophils Absolute: 0.4 10*3/uL (ref 0.0–0.5)
Eosinophils Relative: 3 %
HCT: 43.6 % (ref 39.0–52.0)
Hemoglobin: 14.9 g/dL (ref 13.0–17.0)
Immature Granulocytes: 2 %
Lymphocytes Relative: 26 %
Lymphs Abs: 3.3 10*3/uL (ref 0.7–4.0)
MCH: 33.2 pg (ref 26.0–34.0)
MCHC: 34.2 g/dL (ref 30.0–36.0)
MCV: 97.1 fL (ref 80.0–100.0)
Monocytes Absolute: 1 10*3/uL (ref 0.1–1.0)
Monocytes Relative: 8 %
Neutro Abs: 7.8 10*3/uL — ABNORMAL HIGH (ref 1.7–7.7)
Neutrophils Relative %: 61 %
Platelets: 269 10*3/uL (ref 150–400)
RBC: 4.49 MIL/uL (ref 4.22–5.81)
RDW: 12.6 % (ref 11.5–15.5)
WBC: 12.8 10*3/uL — ABNORMAL HIGH (ref 4.0–10.5)
nRBC: 0 % (ref 0.0–0.2)

## 2018-12-25 LAB — COMPREHENSIVE METABOLIC PANEL
ALT: 37 U/L (ref 0–44)
AST: 31 U/L (ref 15–41)
Albumin: 3.6 g/dL (ref 3.5–5.0)
Alkaline Phosphatase: 100 U/L (ref 38–126)
Anion gap: 9 (ref 5–15)
BUN: 15 mg/dL (ref 6–20)
CO2: 25 mmol/L (ref 22–32)
Calcium: 8.8 mg/dL — ABNORMAL LOW (ref 8.9–10.3)
Chloride: 106 mmol/L (ref 98–111)
Creatinine, Ser: 0.96 mg/dL (ref 0.61–1.24)
GFR calc Af Amer: >60 mL/min (ref >60)
GFR calc non Af Amer: >60 mL/min (ref >60)
Glucose, Bld: 113 mg/dL — ABNORMAL HIGH (ref 70–99)
Potassium: 3.9 mmol/L (ref 3.5–5.1)
Sodium: 140 mmol/L (ref 135–145)
Total Bilirubin: 0.5 mg/dL (ref 0.3–1.2)
Total Protein: 7 g/dL (ref 6.5–8.1)

## 2018-12-25 MED ORDER — KETOROLAC TROMETHAMINE 30 MG/ML IJ SOLN
30.0000 mg | Freq: Once | INTRAMUSCULAR | Status: AC
  Administered 2018-12-25: 30 mg via INTRAVENOUS
  Filled 2018-12-25: qty 1

## 2018-12-25 MED ORDER — LORAZEPAM 2 MG/ML IJ SOLN: 0.5000 mg | Freq: Once | INTRAMUSCULAR | Status: DC

## 2018-12-25 MED ORDER — METOCLOPRAMIDE HCL 5 MG/ML IJ SOLN
10.0000 mg | Freq: Once | INTRAMUSCULAR | Status: AC
  Administered 2018-12-25: 10 mg via INTRAVENOUS
  Filled 2018-12-25: qty 2

## 2018-12-25 MED ORDER — SODIUM CHLORIDE 0.9 % IV SOLN
Freq: Once | INTRAVENOUS | Status: AC
  Administered 2018-12-25: 08:00:00 via INTRAVENOUS

## 2018-12-25 NOTE — ED Notes (Signed)
Patient transported to MRI 

## 2018-12-25 NOTE — ED Notes (Signed)
Patient transported to CT 

## 2018-12-25 NOTE — ED Provider Notes (Signed)
[facility] Emergency Department Provider Note       Time seen: ----------------------------------------- 7:27 AM on 12/25/2018 -----------------------------------------  I have reviewed the triage vital signs and the nursing notes.  HISTORY   Chief Complaint Headache   HPI Marcus Gordon is a 33 y.o. male with a history of arthritis, asthma, Crohn's disease, GERD who presents to the ED for sudden onset of headache to both temples since 2:00 this morning.  He also endorses nausea with 2 episodes of vomiting.  Denies any vision changes.  Patient has never had this happen before.  Past Medical History:  Diagnosis Date  . Arthritis    hips and legs  . Asthma   . Crohn's disease ([place])   . External hemorrhoids   . GERD (gastroesophageal reflux disease)     Patient Active Problem List   Diagnosis Date Noted  . 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 Personal history of digestive disease   . Ulceration of intestine   . Anal fissure   . Benign neoplasm of descending colon   . Arthritis 02/19/2015  . Crohn's disease of colon ([place]) 02/19/2015  . Infection with methicillin-resistant Staphylococcus aureus 02/19/2015  . 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 GERD (gastroesophageal reflux disease) 02/06/2015    Past Surgical History:  Procedure Laterality Date  . COLONOSCOPY    . COLONOSCOPY WITH PROPOFOL N/A 08/14/2015   Procedure: COLONOSCOPY WITH PROPOFOL;  Surgeon: Lucilla Lame, MD;  Location: [place];  Service: Endoscopy;  Laterality: N/A;  . POLYPECTOMY  08/14/2015   Procedure: POLYPECTOMY;  Surgeon: Lucilla Lame, MD;  Location: [place];  Service: Endoscopy;;    Allergies Aspirin, Other, Remicade [infliximab], Dilaudid [hydromorphone hcl], and Morphine and related  Social History Social History   Tobacco Use  . Smoking status: Current Every Day Smoker    Packs/day: 1.00    Years: 13.00    Pack years: 13.00    Types: Cigarettes  . Smokeless tobacco: Never Used  Substance Use Topics  . Alcohol use: Yes    Comment: occ  . Drug use: No   Review of Systems Constitutional: Negative for fever. Cardiovascular: Negative for chest pain. Respiratory: Negative for shortness of breath. Gastrointestinal: Negative for abdominal pain, positive for vomiting Musculoskeletal: Negative for back pain. Skin: Negative for rash. Neurological: Positive for headache  All systems negative/normal/unremarkable except as stated in the HPI  ____________________________________________   PHYSICAL EXAM:  VITAL SIGNS: ED Triage Vitals [12/25/18 0709]  Enc Vitals Group     BP 137/85     Pulse Rate 67     Resp 18     Temp 97.9 F (36.6 C)     Temp Source Oral     SpO2 96 %     Weight 175 lb (79.4 kg)     Height 6' (1.829 m)     Head Circumference      Peak Flow      Pain Score 9     Pain Loc      Pain Edu?      Excl. in [place]?    Constitutional: Alert and oriented.  Mild distress from pain Eyes: Conjunctivae are normal. Normal extraocular movements.  Mild photophobia ENT      Head: Normocephalic and atraumatic.      Nose: No congestion/rhinnorhea.      Mouth/Throat: Mucous membranes are moist.      Neck: No stridor. Cardiovascular: Normal rate, regular rhythm. No murmurs, rubs, or gallops.  Respiratory: Normal respiratory effort without tachypnea nor retractions. Breath sounds are clear and equal bilaterally. No wheezes/rales/rhonchi. Gastrointestinal: Soft and nontender. Normal bowel sounds Musculoskeletal: Nontender with normal range of motion in extremities. No lower extremity tenderness nor edema. Neurologic:  Normal speech and language. No gross focal neurologic deficits are appreciated.  Skin:  Skin is warm, dry and intact. No rash noted. Psychiatric: Mood and affect are normal. Speech and behavior are normal.  ____________________________________________  ED COURSE:  As part of my medical  decision making, I reviewed the following data within the [place] History obtained from family if available, nursing notes, old chart and ekg, as well as notes from prior ED visits. Patient presented for headache and vomiting, we will assess with labs and imaging as indicated at this time.   Procedures  Marcus Gordon was evaluated in Emergency Department on 12/25/2018 for the symptoms described in the history of present illness. He was evaluated in the context of the global COVID-19 pandemic, which necessitated consideration that the patient might be at risk for infection with the SARS-CoV-2 virus that causes COVID-19. Institutional protocols and algorithms that pertain to the evaluation of patients at risk for COVID-19 are in a state of rapid change based on information released by regulatory bodies including the CDC and federal and state organizations. These policies and algorithms were followed during the patient's care in the ED.  ____________________________________________   LABS (pertinent positives/negatives)  Labs Reviewed  CBC WITH DIFFERENTIAL/PLATELET - Abnormal; Notable for the following components:      Result Value   WBC 12.8 (*)    Neutro Abs 7.8 (*)    Abs Immature Granulocytes 0.29 (*)    All other components within normal limits  COMPREHENSIVE METABOLIC PANEL - Abnormal; Notable for the following components:   Glucose, Bld 113 (*)    Calcium 8.8 (*)    All other components within normal limits    RADIOLOGY Images were viewed by me  CT head/MRI IMPRESSION:  Negative MRI head   Negative MRA head  ____________________________________________   DIFFERENTIAL DIAGNOSIS   Migraine, tension headache, subarachnoid hemorrhage  FINAL ASSESSMENT AND PLAN  Headache   Plan: The patient had presented for severe headache. Patient's labs do not reveal any acute process other than mild leukocytosis. Patient's imaging was reassuring, initially with CT  imaging and then followed by MRI.  His acute symptoms do not suggest meningitis and he has no meningismus clinically.  Currently feels better, likely a migraine headache.  He will be referred to neurology for outpatient follow-up.   Laurence Aly, MD    Note: This note was generated in part or whole with voice recognition software. Voice recognition is usually quite accurate but there are transcription errors that can and very often do occur. I apologize for any typographical errors that were not detected and corrected.     Earleen Newport, MD 12/25/18 [phone]

## 2018-12-25 NOTE — ED Triage Notes (Signed)
Patient presents to ED via POV from home with c/o sudden onset of headache to bilateral temple region at 0200 this morning. Patient also endorses nausea with two episodes of vomiting. Denies vision changes. Ambulatory to triage.

## 2019-09-02 ENCOUNTER — Telehealth: Payer: Self-pay

## 2019-09-02 NOTE — Telephone Encounter (Signed)
Pt called requesting a refill of a topical he uses on his scalp, pt report he has sores on his scalp which are making his hair come out when he pull the scabs off. Pt do not know the name of the topical, pt was in the office this year in Jan and Feb for a different problem

## 2019-09-02 NOTE — Telephone Encounter (Signed)
Send Clindamycin solution (or lotion) qd to aa scalp. Disp trade with 1 rf If not scheduled, please make him appt for fu

## 2019-09-03 ENCOUNTER — Other Ambulatory Visit: Payer: Self-pay

## 2019-09-03 MED ORDER — CLINDAMYCIN PHOSPHATE 1 % EX LOTN: TOPICAL_LOTION | Freq: Two times a day (BID) | CUTANEOUS | 2 refills | Status: AC

## 2019-09-03 NOTE — Telephone Encounter (Signed)
Called pt discussed we will erx  Clindamycin lotion 60 ml 1 RF, please schedule an return appt

## 2019-10-07 ENCOUNTER — Telehealth: Payer: Self-pay

## 2019-10-07 NOTE — Telephone Encounter (Signed)
Patient called regarding his scalp. He states you provided him with a medication in the past to help with hair growth. He currently has a patch of no hair on his scalp.  Patient last seen in February but no upcoming appointment.

## 2019-10-07 NOTE — Telephone Encounter (Signed)
The patch of no hair growth MAY be Alopecia Areata, for which we may recommend injections. It would be difficult to know without seeing, and would require him to be in office to inject. So, would recommend he make an appt.

## 2019-10-08 NOTE — Telephone Encounter (Signed)
Patient advised of information. Patient scheduled for tomorrow at 10am.

## 2019-10-09 ENCOUNTER — Other Ambulatory Visit: Payer: Self-pay

## 2019-10-09 ENCOUNTER — Ambulatory Visit (INDEPENDENT_AMBULATORY_CARE_PROVIDER_SITE_OTHER): Payer: Self-pay | Admitting: Dermatology

## 2019-10-09 ENCOUNTER — Encounter: Payer: Self-pay | Admitting: Dermatology

## 2019-10-09 DIAGNOSIS — L739 Follicular disorder, unspecified: Secondary | ICD-10-CM

## 2019-10-09 DIAGNOSIS — L219 Seborrheic dermatitis, unspecified: Secondary | ICD-10-CM

## 2019-10-09 DIAGNOSIS — F633 Trichotillomania: Secondary | ICD-10-CM

## 2019-10-09 DIAGNOSIS — L280 Lichen simplex chronicus: Secondary | ICD-10-CM

## 2019-10-09 MED ORDER — CLINDAMYCIN PHOSPHATE 1 % EX LOTN: TOPICAL_LOTION | CUTANEOUS | 6 refills | Status: DC

## 2019-10-09 MED ORDER — KETOCONAZOLE 2 % EX SHAM: MEDICATED_SHAMPOO | CUTANEOUS | 6 refills | Status: DC

## 2019-10-09 MED ORDER — MOMETASONE FUROATE 0.1 % EX SOLN: CUTANEOUS | 1 refills | Status: DC

## 2019-10-09 NOTE — Progress Notes (Signed)
   Follow-Up Visit   Subjective  Marcus Gordon is a 34 y.o. male who presents for the following: alopecia (patient would like to discuss treatment options for hair regrowth. Originally there was a crust on his scalp and he picked at it causing the hair loss. He is using Clindamycin lotion on his scalp QD).  The following portions of the chart were reviewed this encounter and updated as appropriate:  Tobacco  Allergies  Meds  Problems  Med Hx  Surg Hx  Fam Hx     Review of Systems:  No other skin or systemic complaints except as noted in HPI or Assessment and Plan.  Objective  Well appearing patient in no apparent distress; mood and affect are within normal limits.  A focused examination was performed including face, neck, chest and back and scalp. Relevant physical exam findings are noted in the Assessment and Plan.  Objective  Scalp: Patch of hair loss. No active papules at this time.  Objective  Scalp: Pink patches with greasy scale.   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 Continue Clindamycin lotion to aa's (pimples) PRN flares. Start Mometasone solution to aa (hair loss) QD-BID. Advised patient that it may take a few months for area of hair loss to grow back.   clindamycin (CLEOCIN-T) 1 % lotion - Scalp  mometasone (ELOCON) 0.1 % lotion - Scalp  Seborrheic dermatitis Scalp /sebopsoriasis -  Continue Ketoconazole shampoo 2-3d/wk. Let sit 5-10 minutes before washing off.  ketoconazole (NIZORAL) 2 % shampoo - Scalp  Return if symptoms worsen or fail to improve.  Luther Redo, CMA, am acting as scribe for Sarina Ser, MD .  Documentation: I have reviewed the above documentation for accuracy and completeness, and I agree with the above.  Sarina Ser, MD

## 2020-05-18 ENCOUNTER — Other Ambulatory Visit: Payer: Self-pay

## 2020-05-19 ENCOUNTER — Ambulatory Visit: Payer: Self-pay | Admitting: Gastroenterology

## 2020-05-26 ENCOUNTER — Other Ambulatory Visit: Payer: Self-pay

## 2020-05-26 ENCOUNTER — Emergency Department: Payer: Self-pay

## 2020-05-26 ENCOUNTER — Emergency Department
Admission: EM | Admit: 2020-05-26 | Discharge: 2020-05-26 | Disposition: A | Discharge status: Home / Self Care | Payer: Self-pay | Attending: Emergency Medicine | Admitting: Emergency Medicine

## 2020-05-26 DIAGNOSIS — Z20822 Contact with and (suspected) exposure to covid-19: Secondary | ICD-10-CM | POA: Insufficient documentation

## 2020-05-26 DIAGNOSIS — Z85038 Personal history of other malignant neoplasm of large intestine: Secondary | ICD-10-CM | POA: Insufficient documentation

## 2020-05-26 DIAGNOSIS — K50119 Crohn's disease of large intestine with unspecified complications: Secondary | ICD-10-CM

## 2020-05-26 DIAGNOSIS — J45909 Unspecified asthma, uncomplicated: Secondary | ICD-10-CM | POA: Insufficient documentation

## 2020-05-26 DIAGNOSIS — K529 Noninfective gastroenteritis and colitis, unspecified: Secondary | ICD-10-CM | POA: Insufficient documentation

## 2020-05-26 DIAGNOSIS — F17210 Nicotine dependence, cigarettes, uncomplicated: Secondary | ICD-10-CM | POA: Insufficient documentation

## 2020-05-26 LAB — BASIC METABOLIC PANEL
Anion gap: 9 (ref 5–15)
BUN: 10 mg/dL (ref 6–20)
CO2: 23 mmol/L (ref 22–32)
Calcium: 8.9 mg/dL (ref 8.9–10.3)
Chloride: 106 mmol/L (ref 98–111)
Creatinine, Ser: 0.85 mg/dL (ref 0.61–1.24)
GFR, Estimated: >60 mL/min (ref >60)
Glucose, Bld: 94 mg/dL (ref 70–99)
Potassium: 3.5 mmol/L (ref 3.5–5.1)
Sodium: 138 mmol/L (ref 135–145)

## 2020-05-26 LAB — URINALYSIS, COMPLETE (UACMP) WITH MICROSCOPIC
Bacteria, UA: NONE SEEN
Bilirubin Urine: NEGATIVE
Glucose, UA: NEGATIVE mg/dL
Hgb urine dipstick: NEGATIVE
Ketones, ur: 5 mg/dL — AB
Leukocytes,Ua: NEGATIVE
Nitrite: NEGATIVE
Protein, ur: 30 mg/dL — AB
Specific Gravity, Urine: 1.023 (ref 1.005–1.030)
pH: 5 (ref 5.0–8.0)

## 2020-05-26 LAB — HEPATIC FUNCTION PANEL
ALT: 23 U/L (ref 0–44)
AST: 25 U/L (ref 15–41)
Albumin: 3.5 g/dL (ref 3.5–5.0)
Alkaline Phosphatase: 123 U/L (ref 38–126)
Bilirubin, Direct: <0.1 mg/dL (ref 0.0–0.2)
Total Bilirubin: 0.6 mg/dL (ref 0.3–1.2)
Total Protein: 7.7 g/dL (ref 6.5–8.1)

## 2020-05-26 LAB — CBC
HCT: 40.7 % (ref 39.0–52.0)
Hemoglobin: 13.6 g/dL (ref 13.0–17.0)
MCH: 31.9 pg (ref 26.0–34.0)
MCHC: 33.4 g/dL (ref 30.0–36.0)
MCV: 95.5 fL (ref 80.0–100.0)
Platelets: 434 10*3/uL — ABNORMAL HIGH (ref 150–400)
RBC: 4.26 MIL/uL (ref 4.22–5.81)
RDW: 12.6 % (ref 11.5–15.5)
WBC: 18.9 10*3/uL — ABNORMAL HIGH (ref 4.0–10.5)
nRBC: 0 % (ref 0.0–0.2)

## 2020-05-26 LAB — LIPASE, BLOOD: Lipase: 42 U/L (ref 11–51)

## 2020-05-26 LAB — RESP PANEL BY RT-PCR (FLU A&B, COVID) ARPGX2
Influenza A by PCR: NEGATIVE
Influenza B by PCR: NEGATIVE
SARS Coronavirus 2 by RT PCR: NEGATIVE

## 2020-05-26 MED ORDER — ONDANSETRON HCL 4 MG/2ML IJ SOLN
INTRAMUSCULAR | Status: AC
  Administered 2020-05-26: 4 mg
  Filled 2020-05-26: qty 2

## 2020-05-26 MED ORDER — PREDNISONE 20 MG PO TABS
60.0000 mg | ORAL_TABLET | Freq: Once | ORAL | Status: AC
  Administered 2020-05-26: 60 mg via ORAL
  Filled 2020-05-26: qty 3

## 2020-05-26 MED ORDER — IOHEXOL 9 MG/ML PO SOLN
1000.0000 mL | ORAL | Status: AC | PRN
  Administered 2020-05-26: 1000 mL via ORAL

## 2020-05-26 MED ORDER — SODIUM CHLORIDE 0.9 % IV BOLUS
1000.0000 mL | Freq: Once | INTRAVENOUS | Status: AC
  Administered 2020-05-26: 1000 mL via INTRAVENOUS

## 2020-05-26 MED ORDER — OXYCODONE-ACETAMINOPHEN 5-325 MG PO TABS: 1.0000 | ORAL_TABLET | ORAL | 0 refills | Status: DC | PRN

## 2020-05-26 MED ORDER — PREDNISONE 10 MG PO TABS: ORAL_TABLET | ORAL | 0 refills | Status: DC

## 2020-05-26 MED ORDER — ONDANSETRON 4 MG PO TBDP: 4.0000 mg | ORAL_TABLET | Freq: Three times a day (TID) | ORAL | 0 refills | Status: DC | PRN

## 2020-05-26 MED ORDER — IOHEXOL 300 MG/ML  SOLN
100.0000 mL | Freq: Once | INTRAMUSCULAR | Status: AC | PRN
  Administered 2020-05-26: 100 mL via INTRAVENOUS

## 2020-05-26 MED ORDER — FENTANYL CITRATE (PF) 100 MCG/2ML IJ SOLN
100.0000 ug | Freq: Once | INTRAMUSCULAR | Status: AC
  Administered 2020-05-26: 100 ug via INTRAVENOUS
  Filled 2020-05-26: qty 2

## 2020-05-26 MED ORDER — ONDANSETRON 4 MG PO TBDP: 4.0000 mg | ORAL_TABLET | Freq: Once | ORAL | Status: DC

## 2020-05-26 NOTE — ED Notes (Signed)
Pt dsischarged. Computer in room is frozen. Pt has been provided papers, has signed, and has received last dose of prednisone.

## 2020-05-26 NOTE — ED Notes (Signed)
Pt to ED c/o abdominal pain 8/10 and nausea vomiting. Pain since 2-3 weeks, has Crohns. Vomits "every time I brush my teeth" for past month. Says 18 lb weight loss since March 19 2020.

## 2020-05-26 NOTE — ED Notes (Signed)
Pt sitting on toilet to have BM. Will finish discharge process once pt finished on toilet.

## 2020-05-26 NOTE — Discharge Instructions (Addendum)
Please take your prednisone as prescribed for its entire course.  Please take your pain and nausea medication as needed, as written.  An appointment has been made for you at GI medicine with Dr. Marius Ditch 05/28/2020 at 2 PM.  Please arrive at least 15 minutes early to this appointment.  Return to the emergency department for any worsening pain, nausea vomiting unable to tolerate your medications or fluids, or any other symptom personally concerning to yourself.

## 2020-05-26 NOTE — ED Notes (Signed)
Called CT to inform pt has consumed 750 mL contrast so far.  Girlfriend at bedside.

## 2020-05-26 NOTE — Progress Notes (Signed)
Placed orders for the ER to draw the labs

## 2020-05-26 NOTE — ED Notes (Signed)
Pt to CT

## 2020-05-26 NOTE — ED Provider Notes (Signed)
[facility] Emergency Department Provider Note  Time seen: 11:06 AM  I have reviewed the triage vital signs and the nursing notes.   HISTORY  Chief Complaint Abdominal pain Nausea vomiting  HPI Marcus Gordon is a 35 y.o. male with a past medical history of Crohn's disease, gastric reflux, presents to the emergency department for lower abdominal pain.  According to the patient for the past month or so he has been experiencing abdominal pain more so across the lower abdomen, acutely worse over the past 1 week now with fever at home as high as 101 per patient.  States nausea with occasional episodes of vomiting states over the past week or 2 he has been coughing as well.  States he will have blood in his diarrhea consistent with prior Crohn's flares but states it has been years since he has had a bad flare, but also states he has been out of all of his medications for the past 8 months.  Follows up with Dr. Allen Norris of GI.  States moderate pain mostly across the lower abdomen, dull aching.   Past Medical History:  Diagnosis Date  . Arthritis    hips and legs  . Asthma   . Crohn's disease ([place])   . External hemorrhoids   . GERD (gastroesophageal reflux disease)     Patient Active Problem List   Diagnosis Date Noted  . 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 Personal history of digestive disease   . Ulceration of intestine   . Anal fissure   . Benign neoplasm of descending colon   . Arthritis 02/19/2015  . Crohn's disease of colon ([place]) 02/19/2015  . Infection with methicillin-resistant Staphylococcus aureus 02/19/2015  . 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 GERD (gastroesophageal reflux disease) 02/06/2015    Past Surgical History:  Procedure Laterality Date  . COLONOSCOPY    . COLONOSCOPY WITH PROPOFOL N/A 08/14/2015   Procedure: COLONOSCOPY WITH PROPOFOL;  Surgeon: Lucilla Lame, MD;  Location: [place];  Service: Endoscopy;   Laterality: N/A;  . POLYPECTOMY  08/14/2015   Procedure: POLYPECTOMY;  Surgeon: Lucilla Lame, MD;  Location: [place];  Service: Endoscopy;;    Prior to Admission medications   Medication Sig Start Date End Date Taking? Authorizing Provider  Adalimumab 40 MG/0.4ML PNKT Inject into the skin. 07/21/17   [provider]  ciprofloxacin (CILOXAN) 0.3 % ophthalmic solution Place 2 drops into the left eye every 4 (four) hours while awake. Patient not taking: Reported on 10/09/2019 12/28/16   Birdie Sons, MD  clindamycin (CLEOCIN-T) 1 % lotion Apply topically 2 (two) times daily. 09/03/19 09/02/20  Ralene Bathe, MD  clindamycin (CLEOCIN-T) 1 % lotion Apply to pimples on the scalp once daily until clear. 10/09/19   Ralene Bathe, MD  ketoconazole (NIZORAL) 2 % shampoo 1 APPLICATION ON SKIN AS DIRECTED.SHAMPOO FACE & SCALP 3 DAYS A WEEK. LET SIT SEVERAL MIN & RINSE Patient not taking: Reported on 10/09/2019 08/10/15   [provider]  ketoconazole (NIZORAL) 2 % shampoo Shampoo into scalp let sit 5-10 minutes then wash out. Use 3 days per week. 10/09/19   Ralene Bathe, MD  mometasone (ELOCON) 0.1 % lotion Apply to bald area on scalp once daily 10/09/19   Ralene Bathe, MD    Allergies  Allergen Reactions  . Aspirin     GI upset  . Other     Other reaction(s): Other (See Comments) Anti-inflammatory-GI upset  .  Remicade [Infliximab]     Gastrointestinal agents  . Dilaudid [Hydromorphone Hcl] Rash    Itching  . Morphine And Related Rash    Family History  Problem Relation Age of Onset  . Healthy Mother   . Healthy Father   . Healthy Brother     Social History Social History   Tobacco Use  . Smoking status: Current Every Day Smoker    Packs/day: 1.00    Years: 13.00    Pack years: 13.00    Types: Cigarettes  . Smokeless tobacco: Never Used  Substance Use Topics  . Alcohol use: Yes    Comment: occ  . Drug use: Yes    Types: Marijuana    Review  of Systems Constitutional: Fever to 101 at home.  Afebrile in the emergency department. Cardiovascular: Negative for chest pain. Respiratory: Negative for shortness of breath. Gastrointestinal: Moderate abdominal pain more across the lower abdomen.  Positive for nausea vomiting diarrhea with occasional bloody diarrhea. Genitourinary: Negative for urinary compaints Musculoskeletal: Negative for musculoskeletal complaints Neurological: Negative for headache All other ROS negative  ____________________________________________   PHYSICAL EXAM:  VITAL SIGNS: ED Triage Vitals  Enc Vitals Group     BP 05/26/20 1021 (!) 123/95     Pulse Rate 05/26/20 1019 85     Resp 05/26/20 1019 18     Temp 05/26/20 1019 98.4 F (36.9 C)     Temp Source 05/26/20 1019 Oral     SpO2 05/26/20 1019 99 %     Weight 05/26/20 1021 157 lb (71.2 kg)     Height 05/26/20 1021 6' (1.829 m)     Head Circumference --      Peak Flow --      Pain Score 05/26/20 1014 10     Pain Loc --      Pain Edu? --      Excl. in [place]? --     Constitutional: Alert and oriented. Well appearing and in no distress. Eyes: Normal exam ENT      Head: Normocephalic and atraumatic.      Mouth/Throat: Mucous membranes are moist. Cardiovascular: Normal rate, regular rhythm. Respiratory: Normal respiratory effort without tachypnea nor retractions. Breath sounds are clear Gastrointestinal: Soft, mild lower abdominal tenderness to palpation without rebound guarding or distention.  Benign upper abdominal exam. Musculoskeletal: Nontender with normal range of motion in all extremities.  Neurologic:  Normal speech and language. No gross focal neurologic deficits  Skin:  Skin is warm, dry and intact.  Psychiatric: Mood and affect are normal.  ____________________________________________    EKG  EKG viewed and interpreted by myself shows normal sinus rhythm 81 bpm with a narrow QRS, normal axis, normal intervals, no concerning ST  changes.  ____________________________________________    RADIOLOGY  Chest x-ray negative. CT scan shows mild to moderate colitis mostly of the ascending and transverse colon.  ____________________________________________   INITIAL IMPRESSION / ASSESSMENT AND PLAN / ED COURSE  Pertinent labs & imaging results that were available during my care of the patient were reviewed by me and considered in my medical decision making (see chart for details).   Patient presents to the emergency department for lower abdominal pain reported fever at home nausea vomiting diarrhea with occasional bloody diarrhea per patient.  History of Crohn's disease has been off of his medications for the past 8 months.  Patient also states cough.  We will check labs, dose pain and nausea medication, IV hydrate.  Will obtain CT imaging  of the abdomen/pelvis to evaluate for intra-abdominal pathology or Crohn's flare.  We will obtain a chest x-ray for the patient's reported cough and fever.  We will also obtain a Covid swab as a precaution.  Patient's basic labs are largely nonrevealing besides a moderate leukocytosis of 18,000.  I have added on a hepatic function panel as well as a lipase, Covid swab and chest x-ray.  Patient's labs are largely nonrevealing besides leukocytosis.  CT scan consistent with Crohn's flare with ascending and transverse colitis.  I discussed the findings with the patient, and offered admission given his nausea and vomiting, however he wishes to go home.  We will place the patient on a prednisone prolonged taper.  We will discharge with short course of pain and nausea medication as well.  I have sent a message to the patient's GI physician as he states his next appointment is not for 3 or 4 months to hopefully aid in the patient getting a sooner appointment. Provided return precautions.  Marcus Gordon was evaluated in Emergency Department on 05/26/2020 for the symptoms described in the history of  present illness. He was evaluated in the context of the global COVID-19 pandemic, which necessitated consideration that the patient might be at risk for infection with the SARS-CoV-2 virus that causes COVID-19. Institutional protocols and algorithms that pertain to the evaluation of patients at risk for COVID-19 are in a state of rapid change based on information released by regulatory bodies including the CDC and federal and state organizations. These policies and algorithms were followed during the patient's care in the ED.  ____________________________________________   FINAL CLINICAL IMPRESSION(S) / ED DIAGNOSES  Abdominal pain Nausea vomiting diarrhea Fever Colitis   Harvest Dark, MD 05/26/20 1429

## 2020-05-26 NOTE — ED Triage Notes (Addendum)
Pt comes from [facility] with c/o possible Crohn's flare up. Pt states abdominal pain, rectal bleeding and 18 lb weight loss in last weight. Pt states increased weakness and fatigue. Pt unable to eat and drink or keep anything down.  Pt states hurting in chest and acid reflux  Pt states vomiting. Pt appears pale. Pt tearful in triage.

## 2020-05-28 ENCOUNTER — Other Ambulatory Visit: Payer: Self-pay

## 2020-05-28 ENCOUNTER — Encounter: Payer: Self-pay | Admitting: Gastroenterology

## 2020-05-28 ENCOUNTER — Ambulatory Visit (INDEPENDENT_AMBULATORY_CARE_PROVIDER_SITE_OTHER): Payer: Self-pay | Admitting: Gastroenterology

## 2020-05-28 VITALS — BP 106/68 | HR 73 | Temp 97.9°F | Ht 72.0 in | Wt 158.2 lb

## 2020-05-28 DIAGNOSIS — K50811 Crohn's disease of both small and large intestine with rectal bleeding: Secondary | ICD-10-CM

## 2020-05-28 MED ORDER — NA SULFATE-K SULFATE-MG SULF 17.5-3.13-1.6 GM/177ML PO SOLN: 354.0000 mL | Freq: Once | ORAL | 0 refills | Status: AC

## 2020-05-28 NOTE — Patient Instructions (Signed)
Please fax [place] the insurance card to [phone]

## 2020-05-28 NOTE — Progress Notes (Signed)
Cephas Darby, MD [address]  [place]  [place], [place] [address]  Main: [phone]  Fax: [phone]    Gastroenterology Consultation  Referring Provider:     Birdie Sons, MD Primary Care Physician:  Birdie Sons, MD Primary Gastroenterologist:  Dr. Lucilla Lame Reason for Consultation:     Crohn's disease        HPI:   Marcus Gordon is a 34 y.o. male referred by Dr. Birdie Sons, MD  for consultation & management of Crohn's disease  Patient had a recent flareup of his Crohn's disease, went to ER on 3/22 secondary to abdominal pain, rectal bleeding, diarrhea as well as 18 pound weight loss associated with worsening fatigue and weakness, he is unable to eat or drink, also with reflux.  He is found to have significant leukocytosis, WBC 18.9, thrombocytosis, hemoglobin 13.6, CMP normal.  He underwent CT abdomen and pelvis which revealed thickening of the right colon.  He is discharged home on prednisone 40 mg with taper.  Patient was last seen by GI in 2017, at that time he underwent colonoscopy which revealed mild disease only.  He was maintained on biweekly Humira until 8 months ago, lost his disability and is no longer able to afford  He has been on prednisone 40 mg for last 2 days, reports that his appetite is picking up slowly, he had 2 bowel movements today, rectal bleeding is improving, denies abdominal pain.  He denies any joint pains.  He denies fever, chills, nausea or vomiting  He does vape, smokes marijuana.  He also admits to smoking tobacco  Patient denies family history of IBD, GI malignancy  Crohn's disease classification:  Age: < 17 Location: ileocolonic Behavior: non stricturing, non penetrating  Perianal:  no  IBD diagnosis: At the age of 58  Disease course:Patient is diagnosed with Crohn's disease at age 2, initial presentation of rectal bleeding, diarrhea, abdominal pain and weight loss, he was initially on prednisone, then  Remicade, patient reports that he developed allergic reaction to it (hives all over his body), therefore switched to Humira every other week.  Patient reports that he has been on Humira until 8 months ago and stopped as he lost his insurance.  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 Resected and retrieved. - A few ulcers in the terminal ileum. Biopsied. - Mucosal ulceration. Biopsied. - Anal fissure found on digital rectal exam. - Non-bleeding internal hemorrhoids.    Colonoscopy 08/02/2012  The examined portion of the ileum was normal. Biopsied.-Inflammationwas found in the colon secondary to Crohn's Disease with colonic involvement . Non-bleeding internal hemorrhoids. Multiple biopsies were obtained.  Diagnosis:  Part A: COLD BIOPSY TERMINAL ILEUM:  - SMALL BOWEL MUCOSA WITH PRESERVED VILLOUS ARCHITECTURE.  - NEGATIVE FOR INTRAEPITHELIAL LYMPHOCYTOSIS, DYSPLASIA AND  MALIGNANCY.  .  Part B: COLD BIOPSY RIGHT COLON:  - FOCAL ACTIVE COLITIS, SEE COMMENT.  - NEGATIVE FOR GRANULOMATA, INFECTIOUS PATHOGENS AND MALIGNANCY.  .  Part C: COLD BIOPSY LEFT COLON:  - NO SIGNIFICANT PATHOLOGIC CHANGES.  - NEGATIVE FOR INTRAEPITHELIAL LYMPHOCYTOSIS, DYSPLASIA AND  MALIGNANCY.    Upper Endoscopy none  VCE none  IBD medications:  Steroids: Responsive to prednisone 5-ASA: None  Immunomodulators: AZA, methotrexate nave TPMT status unknown Biologics:  Anti TNFs:  Developed allergic reaction to Remicade.  Previously maintained on Humira biweekly in clinical remission Anti Integrins: Ustekinumab: Tofactinib: Clinical trial:   NSAIDs: None  Antiplts/Anticoagulants/Anti thrombotics: None   Past Medical History:  Diagnosis Date  . Arthritis    hips and legs  .  Asthma   . Crohn's disease ([place])   . External hemorrhoids   . GERD (gastroesophageal reflux disease)     Past Surgical History:  Procedure Laterality Date  . COLONOSCOPY    . COLONOSCOPY WITH PROPOFOL N/A 08/14/2015   Procedure: COLONOSCOPY WITH PROPOFOL;  Surgeon: Lucilla Lame, MD;  Location: [facility];  Service: Endoscopy;  Laterality: N/A;  . POLYPECTOMY  08/14/2015   Procedure: POLYPECTOMY;  Surgeon: Lucilla Lame, MD;  Location: [place];  Service: Endoscopy;;   Current Outpatient Medications:  .  Na Sulfate-K Sulfate-Mg Sulf 17.5-3.13-1.6 GM/177ML SOLN, Take 354 mLs by mouth once for 1 dose., Disp: 354 mL, Rfl: 0 .  oxyCODONE-acetaminophen (PERCOCET) 5-325 MG tablet, Take 1 tablet by mouth every 4 (four) hours as needed for moderate pain., Disp: 12 tablet, Rfl: 0 .  predniSONE (DELTASONE) 10 MG tablet, Day 1-7: Take 4 tablets p.o. daily. Day 8-14: Take 3 tablets p.o. daily. Day 15-21: Take 2 tablets p.o. daily. Day 22-28: Take 1 tablet p.o. daily, Disp: 70 tablet, Rfl: 0 .  Adalimumab 40 MG/0.4ML PNKT, Inject into the skin. (Patient not taking: Reported on 05/28/2020), Disp: , Rfl:  .  ciprofloxacin (CILOXAN) 0.3 % ophthalmic solution, Place 2 drops into the left eye every 4 (four) hours while awake. (Patient not taking: No sig reported), Disp: 5 mL, Rfl: 0 .  clindamycin (CLEOCIN-T) 1 % lotion, Apply topically 2 (two) times daily. (Patient not taking: Reported on 05/28/2020), Disp: 60 mL, Rfl: 2 .  clindamycin (CLEOCIN-T) 1 % lotion, Apply to pimples on the scalp once daily until clear. (Patient not taking: Reported on 05/28/2020), Disp: 60 mL, Rfl: 6 .  ketoconazole (NIZORAL) 2 % shampoo, 1 APPLICATION ON SKIN AS DIRECTED.SHAMPOO FACE & SCALP 3 DAYS A WEEK. LET SIT SEVERAL MIN & RINSE (Patient not taking: No sig reported), Disp: , Rfl: 0 .  ketoconazole (NIZORAL) 2 % shampoo, Shampoo into scalp let sit 5-10 minutes then wash out. Use 3 days per week. (Patient not taking:  Reported on 05/28/2020), Disp: 120 mL, Rfl: 6 .  mometasone (ELOCON) 0.1 % lotion, Apply to bald area on scalp once daily (Patient not taking: Reported on 05/28/2020), Disp: 60 mL, Rfl: 1 .  ondansetron (ZOFRAN ODT) 4 MG disintegrating tablet, Take 1 tablet (4 mg total) by mouth every 8 (eight) hours as needed for nausea or vomiting. (Patient not taking: Reported on 05/28/2020), Disp: 20 tablet, Rfl: 0    Family History  Problem Relation Age of Onset  . Healthy Mother   . Healthy Father   . Healthy Brother      Social History   Tobacco Use  . Smoking status: Current Every Day Smoker    Packs/day: 1.00    Years: 13.00    Pack years: 13.00    Types: Cigarettes  . Smokeless tobacco: Never Used  Substance Use Topics  . Alcohol use: Yes    Comment: occ  . Drug use: Yes    Types: Marijuana    Allergies as of 05/28/2020 - Review Complete 05/28/2020  Allergen Reaction Noted  . Aspirin  02/06/2015  . Other  02/06/2015  . Remicade [infliximab]  02/06/2015  . Dilaudid [hydromorphone hcl] Rash 02/06/2015  .  Morphine and related Rash 02/06/2015    Review of Systems:    All systems reviewed and negative except where noted in HPI.   Physical Exam:  BP 106/68 (BP Location: Left Arm, Patient Position: Sitting, Cuff Size: Normal)   Pulse 73   Temp 97.9 F (36.6 C) (Oral)   Ht 6' (1.829 m)   Wt 158 lb 4 oz (71.8 kg)   BMI 21.46 kg/m  No LMP for male patient.  General:   Alert, thin built, moderately nourished, pleasant and cooperative in NAD Head:  Normocephalic and atraumatic. Eyes:  Sclera clear, no icterus.   Conjunctiva pink. Ears:  Normal auditory acuity. Nose:  No deformity, discharge, or lesions. Mouth:  No deformity or lesions,oropharynx pink & moist. Neck:  Supple; no masses or thyromegaly. Lungs:  Respirations even and unlabored.  Clear throughout to auscultation.   No wheezes, crackles, or rhonchi. No acute distress. Heart:  Regular rate and rhythm; no murmurs,  clicks, rubs, or gallops. Abdomen:  Normal bowel sounds. Soft, non-tender and mildly distended tympanic to percussion, without masses, hepatosplenomegaly or hernias noted.  No guarding or rebound tenderness.   Rectal: Not performed Msk:  Symmetrical without gross deformities. Good, equal movement & strength bilaterally. Pulses:  Normal pulses noted. Extremities:  No clubbing or edema.  No cyanosis. Neurologic:  Alert and oriented x3;  grossly normal neurologically. Skin:  Intact without significant lesions or rashes. No jaundice. Lymph Nodes:  No significant cervical adenopathy. Psych:  Alert and cooperative. Normal mood and affect.  Imaging Studies: CT abdomen and pelvis 05/26/2020 IMPRESSION: Mild-to-moderate colitis predominantly involving the ascending and transverse colon. No evidence of abscess or other complication.  Mild right lower quadrant and pericolonic mesenteric lymphadenopathy, likely reactive in etiology.  Tiny nonobstructing left renal calculus.  Assessment and Plan:   Marcus Gordon is a 35 y.o. African-American male with history of ileocolonic Crohn's disease diagnosed at age 65, no stricturing nonpenetrating phenotype, sacroiliitis with spondyloarthropathy, allergic reaction to Remicade, previously maintained on biweekly Humira monotherapy until 2021 presented with recent flareup of abdominal pain, weight loss, loss of appetite, bloody diarrhea  Flareup of Crohn's disease Recommend stool studies to rule out infection Check fecal calprotectin levels, CRP Recommend colonoscopy with TI evaluation to assess severity of the disease Check viral hepatitis panel, QuantiFERON gold, TPMT levels We will apply for Humira as a fresh start, for this patient said he is on Medicaid, we do not have any documentation of it.  We have asked him to fax [place] his insurance information so that we can apply Humira Continue prednisone 40 mg daily for now   Follow up in 4 to 6  weeks   Cephas Darby, MD

## 2020-06-01 ENCOUNTER — Telehealth: Payer: Self-pay

## 2020-06-01 LAB — CBC
Hematocrit: 37.1 % — ABNORMAL LOW (ref 37.5–51.0)
Hemoglobin: 12.8 g/dL — ABNORMAL LOW (ref 13.0–17.7)
MCH: 32.3 pg (ref 26.6–33.0)
MCHC: 34.5 g/dL (ref 31.5–35.7)
MCV: 94 fL (ref 79–97)
Platelets: 488 10*3/uL — ABNORMAL HIGH (ref 150–450)
RBC: 3.96 x10E6/uL — ABNORMAL LOW (ref 4.14–5.80)
RDW: 12.7 % (ref 11.6–15.4)
WBC: 13.5 10*3/uL — ABNORMAL HIGH (ref 3.4–10.8)

## 2020-06-01 LAB — COMPREHENSIVE METABOLIC PANEL
ALT: 19 IU/L (ref 0–44)
AST: 17 IU/L (ref 0–40)
Albumin/Globulin Ratio: 1.4 (ref 1.2–2.2)
Albumin: 3.8 g/dL — ABNORMAL LOW (ref 4.0–5.0)
Alkaline Phosphatase: 130 IU/L — ABNORMAL HIGH (ref 44–121)
BUN/Creatinine Ratio: 19 (ref 9–20)
BUN: 15 mg/dL (ref 6–20)
Bilirubin Total: <0.2 mg/dL (ref 0.0–1.2)
CO2: 21 mmol/L (ref 20–29)
Calcium: 9.1 mg/dL (ref 8.7–10.2)
Chloride: 105 mmol/L (ref 96–106)
Creatinine, Ser: 0.8 mg/dL (ref 0.76–1.27)
Globulin, Total: 2.7 g/dL (ref 1.5–4.5)
Glucose: 90 mg/dL (ref 65–99)
Potassium: 3.9 mmol/L (ref 3.5–5.2)
Sodium: 142 mmol/L (ref 134–144)
Total Protein: 6.5 g/dL (ref 6.0–8.5)
eGFR: 119 mL/min/{1.73_m2} (ref >59)

## 2020-06-01 LAB — QUANTIFERON-TB GOLD PLUS
QuantiFERON Mitogen Value: >10 IU/mL
QuantiFERON Nil Value: 0.04 IU/mL
QuantiFERON TB1 Ag Value: 0.04 IU/mL
QuantiFERON TB2 Ag Value: 0.04 IU/mL
QuantiFERON-TB Gold Plus: NEGATIVE

## 2020-06-01 LAB — HEPATITIS B SURFACE ANTIBODY,QUALITATIVE: Hep B Surface Ab, Qual: REACTIVE

## 2020-06-01 LAB — B12 AND FOLATE PANEL
Folate: 5.4 ng/mL (ref >3.0)
Vitamin B-12: 675 pg/mL (ref 232–1245)

## 2020-06-01 LAB — VITAMIN D 25 HYDROXY (VIT D DEFICIENCY, FRACTURES): Vit D, 25-Hydroxy: 9.9 ng/mL — ABNORMAL LOW (ref 30.0–100.0)

## 2020-06-01 LAB — IRON,TIBC AND FERRITIN PANEL
Ferritin: 247 ng/mL (ref 30–400)
Iron Saturation: 25 % (ref 15–55)
Iron: 67 ug/dL (ref 38–169)
Total Iron Binding Capacity: 272 ug/dL (ref 250–450)
UIBC: 205 ug/dL (ref 111–343)

## 2020-06-01 LAB — THIOPURINE METHYLTRANSFERASE (TPMT), RBC: TPMT Activity:: 20.1 Units/mL RBC

## 2020-06-01 LAB — HEPATITIS B CORE ANTIBODY, TOTAL: Hep B Core Total Ab: NEGATIVE

## 2020-06-01 LAB — HEPATITIS B SURFACE ANTIGEN: Hepatitis B Surface Ag: NEGATIVE

## 2020-06-01 LAB — HEPATITIS A ANTIBODY, TOTAL: hep A Total Ab: NEGATIVE

## 2020-06-01 LAB — C-REACTIVE PROTEIN: CRP: 47 mg/L — ABNORMAL HIGH (ref 0–10)

## 2020-06-01 MED ORDER — VITAMIN D (ERGOCALCIFEROL) 1.25 MG (50000 UNIT) PO CAPS: 50000.0000 [IU] | ORAL_CAPSULE | ORAL | 1 refills | Status: DC

## 2020-06-01 NOTE — Telephone Encounter (Signed)
-----   Message from Lin Landsman, MD sent at 05/29/2020  2:17 PM EDT ----- Severe vitamin D deficiency, recommend vitamin D 50,000 units once a week for 12 weeks and 1 refill  RV

## 2020-06-01 NOTE — Telephone Encounter (Signed)
Patient verbalized understanding sent medication to the pharmacy. Asked patient if he has his insurance card. He states yes he will fax it to [place] or bring it by

## 2020-06-08 ENCOUNTER — Other Ambulatory Visit: Payer: Self-pay | Attending: Gastroenterology

## 2020-06-09 ENCOUNTER — Telehealth: Payer: Self-pay

## 2020-06-09 NOTE — Telephone Encounter (Signed)
Pt has not completed COVID test for colonoscopy tomorrow.  Trish tried to contact and no response.  Thanks,  [place], [place]

## 2020-06-09 NOTE — Telephone Encounter (Signed)
Tried to call patient and patient answer twice and when I asked if this was Marcus Gordon he disconnected the phone. Informed trish to cancel the procedure

## 2020-06-10 ENCOUNTER — Encounter: Admission: RE | Payer: Self-pay | Source: Home / Self Care

## 2020-06-10 ENCOUNTER — Ambulatory Visit: Admission: RE | Admit: 2020-06-10 | Payer: Self-pay | Source: Home / Self Care | Admitting: Gastroenterology

## 2020-06-10 SURGERY — COLONOSCOPY WITH PROPOFOL
Anesthesia: General

## 2020-06-30 ENCOUNTER — Encounter: Payer: Self-pay | Admitting: Gastroenterology

## 2020-06-30 ENCOUNTER — Ambulatory Visit: Payer: Self-pay | Admitting: Gastroenterology

## 2020-09-03 ENCOUNTER — Ambulatory Visit (INDEPENDENT_AMBULATORY_CARE_PROVIDER_SITE_OTHER): Payer: Self-pay | Admitting: Dermatology

## 2020-09-03 ENCOUNTER — Other Ambulatory Visit: Payer: Self-pay

## 2020-09-03 DIAGNOSIS — L739 Follicular disorder, unspecified: Secondary | ICD-10-CM

## 2020-09-03 DIAGNOSIS — L03818 Cellulitis of other sites: Secondary | ICD-10-CM

## 2020-09-03 DIAGNOSIS — L441 Lichen nitidus: Secondary | ICD-10-CM

## 2020-09-03 DIAGNOSIS — L219 Seborrheic dermatitis, unspecified: Secondary | ICD-10-CM

## 2020-09-03 DIAGNOSIS — F633 Trichotillomania: Secondary | ICD-10-CM

## 2020-09-03 MED ORDER — MUPIROCIN 2 % EX OINT: TOPICAL_OINTMENT | CUTANEOUS | 0 refills | Status: DC

## 2020-09-03 MED ORDER — KETOCONAZOLE 2 % EX SHAM: MEDICATED_SHAMPOO | CUTANEOUS | 6 refills | Status: DC

## 2020-09-03 MED ORDER — DOXYCYCLINE HYCLATE 100 MG PO CAPS: 100.0000 mg | ORAL_CAPSULE | Freq: Two times a day (BID) | ORAL | 0 refills | Status: AC

## 2020-09-03 MED ORDER — CLINDAMYCIN PHOSPHATE 1 % EX LOTN: TOPICAL_LOTION | CUTANEOUS | 6 refills | Status: DC

## 2020-09-03 MED ORDER — MOMETASONE FUROATE 0.1 % EX SOLN: CUTANEOUS | 3 refills | Status: DC

## 2020-09-03 NOTE — Progress Notes (Signed)
Follow-Up Visit   Subjective  Marcus Gordon is a 35 y.o. male who presents for the following: Folliculitis (With hx of trichotillomania of the scalp. Patient picks and scratches at areas. Patient needs refills for clindamycin lotion and mometasone lotion. ), Seborrheic Dermatitis (/sebopsoriasis of the scalp. Patient improved with ketoconazole shampoo, but needs refills.), and Bump (Penis x 1 week. Patient picked at area and it has gotten bigger. ).  The following portions of the chart were reviewed this encounter and updated as appropriate:   Tobacco  Allergies  Meds  Problems  Med Hx  Surg Hx  Fam Hx     Review of Systems:  No other skin or systemic complaints except as noted in HPI or Assessment and Plan.  Objective  Well appearing patient in no apparent distress; mood and affect are within normal limits.  A focused examination was performed including face, scalp, groin. Relevant physical exam findings are noted in the Assessment and Plan.  Scalp Clear today, no active papules.  Scalp Pink patches with greasy scale.   penis Papule on the volar aspect of penis.  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  Currently improved Scalp Chronic and persistent.  Currently improved and mostly clear, but pt says not to goal.  Avoid picking/scratching.  Continue Clindamycin lotion Apply to AA scalp QD dsp 68mL. Continue mometasone lotion Apply to AA scalp QD no more than 3 days/wk.  Topical steroids (such as triamcinolone, fluocinolone, fluocinonide, mometasone, clobetasol, halobetasol, betamethasone, hydrocortisone) can cause thinning and lightening of the skin if they are used for too long in the same area. Your physician has selected the right strength medicine for your problem and area affected on the body. Please use your medication only as directed by your physician to prevent side effects.    Related Medications clindamycin (CLEOCIN-T) 1 % lotion Apply to pimples on the scalp once daily until clear.  mometasone (ELOCON) 0.1 % lotion Apply to affected area on scalp once daily no more than 3 days a week.  Seborrheic dermatitis Scalp /Sebopsoriasis  Seborrheic Dermatitis  -  is a chronic persistent rash characterized by pinkness and scaling most commonly of the mid face but also can occur on the scalp (dandruff), ears; mid chest and mid back. It tends to be exacerbated by stress and cooler weather.  People who have neurologic disease may experience new onset or exacerbation of existing seborrheic dermatitis.  The condition is not curable but treatable and can be controlled.  Better controlled. Continue Ketoconazole shampoo 2-3d/wk. Let sit 5-10 minutes before washing off. Mometasone lotion as needed  Related Medications ketoconazole (NIZORAL) 2 % shampoo Shampoo into scalp let sit 5-10 minutes then wash out. Use 3 days per week.  Cellulitis of other specified site penis Hx of pustule.  Now only with hypopigmented spot remaining. Avoid picking.  Start mupirocin 2% ointment Apply to AA BID x 1-2 wks until improved.  Start doxycycline 100mg  take 1 po BID x 1 week dsp #14 0Rf.  Return for reevaluation if recurs or new lesions form  Doxycycline should be taken with food to prevent nausea. Do not lay down for 30 minutes after taking. Be cautious with sun exposure and use good sun protection while on this medication. Pregnant women should not take this medication.   mupirocin ointment (BACTROBAN) 2 % - penis Apply to affected area in groin twice daily x 1-2 weeks until improved.  doxycycline (VIBRAMYCIN) 100 MG capsule - penis Take 1 capsule (100 mg total) by mouth 2 (two) times daily for 7 days. Take with food and plenty of fluids.  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 I advised the patient that  it appears to be benign oil glands but he felt they had never been there before.  We will reevaluate if does not improve or worsens.  Start mometasone lotion to AA QD prn.  Return if symptoms worsen or fail to improve.  Lindi Adie, CMA, am acting as scribe for Sarina Ser, MD .  Documentation: I have reviewed the above documentation for accuracy and completeness, and I agree with the above.  Sarina Ser, MD

## 2020-09-03 NOTE — Patient Instructions (Signed)

## 2020-09-12 ENCOUNTER — Encounter: Payer: Self-pay | Admitting: Dermatology

## 2021-06-08 ENCOUNTER — Other Ambulatory Visit: Payer: Self-pay

## 2021-06-08 DIAGNOSIS — L219 Seborrheic dermatitis, unspecified: Secondary | ICD-10-CM

## 2021-06-08 DIAGNOSIS — L739 Follicular disorder, unspecified: Secondary | ICD-10-CM

## 2021-06-08 MED ORDER — MOMETASONE FUROATE 0.1 % EX SOLN
CUTANEOUS | 0 refills | Status: DC
Start: 2021-06-08 — End: 2021-08-17

## 2021-06-08 MED ORDER — KETOCONAZOLE 2 % EX SHAM: MEDICATED_SHAMPOO | CUTANEOUS | 0 refills | Status: DC

## 2021-06-08 NOTE — Progress Notes (Signed)
Patient called today asking for RFs of Ketoconazole Shampoo and Mometasone Lotion.  1 RF of each sent in. aw ?

## 2021-08-17 ENCOUNTER — Encounter: Payer: Self-pay | Admitting: Dermatology

## 2021-08-17 ENCOUNTER — Ambulatory Visit (INDEPENDENT_AMBULATORY_CARE_PROVIDER_SITE_OTHER): Payer: Self-pay | Admitting: Dermatology

## 2021-08-17 DIAGNOSIS — L03818 Cellulitis of other sites: Secondary | ICD-10-CM

## 2021-08-17 DIAGNOSIS — L739 Follicular disorder, unspecified: Secondary | ICD-10-CM

## 2021-08-17 DIAGNOSIS — A630 Anogenital (venereal) warts: Secondary | ICD-10-CM

## 2021-08-17 DIAGNOSIS — K50919 Crohn's disease, unspecified, with unspecified complications: Secondary | ICD-10-CM

## 2021-08-17 DIAGNOSIS — L219 Seborrheic dermatitis, unspecified: Secondary | ICD-10-CM

## 2021-08-17 MED ORDER — DOXYCYCLINE HYCLATE 100 MG PO TABS: 100.0000 mg | ORAL_TABLET | Freq: Two times a day (BID) | ORAL | 0 refills | Status: DC

## 2021-08-17 MED ORDER — IMIQUIMOD 5 % EX CREA: TOPICAL_CREAM | Freq: Every day | CUTANEOUS | 3 refills | Status: DC

## 2021-08-17 MED ORDER — KETOCONAZOLE 2 % EX SHAM: MEDICATED_SHAMPOO | CUTANEOUS | 11 refills | Status: DC

## 2021-08-17 MED ORDER — CLINDAMYCIN PHOSPHATE 1 % EX LOTN: TOPICAL_LOTION | CUTANEOUS | 6 refills | Status: DC

## 2021-08-17 NOTE — Progress Notes (Signed)
   Follow-Up Visit   Subjective  Marcus Gordon is a 36 y.o. male who presents for the following: Follow-up (1 year follow up of folliculitis/rosacea of the scalp treated in the past with clindamycin and mometasone) and Other (Hx of cellulitis of the penis treated in the past with doxycycline).  The following portions of the chart were reviewed this encounter and updated as appropriate:   Tobacco  Allergies  Meds  Problems  Med Hx  Surg Hx  Fam Hx     Review of Systems:  No other skin or systemic complaints except as noted in HPI or Assessment and Plan.  Objective  Well appearing patient in no apparent distress; mood and affect are within normal limits.  A focused examination was performed including scalp, groin area. Relevant physical exam findings are noted in the Assessment and Plan.  Scalp Pustule of scalp  Dorsal Penile Shaft Papule with crust   Assessment & Plan  Folliculitis Scalp Folliculitis/Rosacea clindamycin (CLEOCIN-T) 1 % lotion - Scalp Apply to pimples on the scalp once daily until clear.  Seborrheic dermatitis Scalp Seborrheic Dermatitis  -  is a chronic persistent rash characterized by pinkness and scaling most commonly of the mid face but also can occur on the scalp (dandruff), ears; mid chest, mid back and groin.  It tends to be exacerbated by stress and cooler weather.  People who have neurologic disease may experience new onset or exacerbation of existing seborrheic dermatitis.  The condition is not curable but treatable and can be controlled.  Restart Ketoconazole 2% shampoo 3 times per week  Related Medications ketoconazole (NIZORAL) 2 % shampoo Shampoo into scalp let sit 5-10 minutes then wash out. Use 3 days per week.  Cellulitis of other specified site -  Crusted papule Penile Shaft Doxycyline 100 mg 1 po bid x 2 weeks with food doxycycline (VIBRA-TABS) 100 MG tablet - Dorsal Penile Shaft Take 1 tablet (100 mg total) by mouth 2 (two)  times daily.  Related Medications mupirocin ointment (BACTROBAN) 2 % Apply to affected area in groin twice daily x 1-2 weeks until improved.  Condyloma Pubic; genital imiquimod (ALDARA) 5 % cream - Pubic; genital Apply topically at bedtime.  Crohn's Disease Currently on Humira  Return if symptoms worsen or fail to improve.  I, Ashok Cordia, CMA, am acting as scribe for Sarina Ser, MD . Documentation: I have reviewed the above documentation for accuracy and completeness, and I agree with the above.  Sarina Ser, MD

## 2021-08-17 NOTE — Patient Instructions (Signed)
Due to recent changes in healthcare laws, you may see results of your pathology and/or laboratory studies on MyChart before the doctors have had a chance to review them. We understand that in some cases there may be results that are confusing or concerning to you. Please understand that not all results are received at the same time and often the doctors may need to interpret multiple results in order to provide you with the best plan of care or course of treatment. Therefore, we ask that you please give us 2 business days to thoroughly review all your results before contacting the office for clarification. Should we see a critical lab result, you will be contacted sooner.   If You Need Anything After Your Visit  If you have any questions or concerns for your doctor, please call our main line at [phone] and press option 4 to reach your doctor's medical assistant. If no one answers, please leave a voicemail as directed and we will return your call as soon as possible. Messages left after 4 pm will be answered the following business day.   You may also send us a message via MyChart. We typically respond to MyChart messages within 1-2 business days.  For prescription refills, please ask your pharmacy to contact our office. Our fax number is [phone].  If you have an urgent issue when the clinic is closed that cannot wait until the next business day, you can page your doctor at the number below.    Please note that while we do our best to be available for urgent issues outside of office hours, we are not available 24/7.   If you have an urgent issue and are unable to reach us, you may choose to seek medical care at your doctor's office, retail clinic, urgent care center, or emergency room.  If you have a medical emergency, please immediately call 911 or go to the emergency department.  Pager Numbers  - Dr. Kowalski: [phone]  - Dr. Moye: [phone]  - Dr. Stewart:  [phone]  In the event of inclement weather, please call our main line at [phone] for an update on the status of any delays or closures.  Dermatology Medication Tips: Please keep the boxes that topical medications come in in order to help keep track of the instructions about where and how to use these. Pharmacies typically print the medication instructions only on the boxes and not directly on the medication tubes.   If your medication is too expensive, please contact our office at [phone] option 4 or send us a message through MyChart.   We are unable to tell what your co-pay for medications will be in advance as this is different depending on your insurance coverage. However, we may be able to find a substitute medication at lower cost or fill out paperwork to get insurance to cover a needed medication.   If a prior authorization is required to get your medication covered by your insurance company, please allow us 1-2 business days to complete this process.  Drug prices often vary depending on where the prescription is filled and some pharmacies may offer cheaper prices.  The website www.goodrx.com contains coupons for medications through different pharmacies. The prices here do not account for what the cost may be with help from insurance (it may be cheaper with your insurance), but the website can give you the price if you did not use any insurance.  - You can print the associated coupon and take it with   your prescription to the pharmacy.  - You may also stop by our office during regular business hours and pick up a GoodRx coupon card.  - If you need your prescription sent electronically to a different pharmacy, notify our office through [facility] MyChart or by phone at [phone] option 4.     Si Usted Necesita Algo Despus de Su Visita  Tambin puede enviarnos un mensaje a travs de MyChart. Por lo general respondemos a los mensajes de MyChart en el transcurso de 1 a 2  das hbiles.  Para renovar recetas, por favor pida a su farmacia que se ponga en contacto con nuestra oficina. Nuestro nmero de fax es el [phone].  Si tiene un asunto urgente cuando la clnica est cerrada y que no puede esperar hasta el siguiente da hbil, puede llamar/localizar a su doctor(a) al nmero que aparece a continuacin.   Por favor, tenga en cuenta que aunque hacemos todo lo posible para estar disponibles para asuntos urgentes fuera del horario de oficina, no estamos disponibles las 24 horas del da, los 7 das de la semana.   Si tiene un problema urgente y no puede comunicarse con nosotros, puede optar por buscar atencin mdica  en el consultorio de su doctor(a), en una clnica privada, en un centro de atencin urgente o en una sala de emergencias.  Si tiene una emergencia mdica, por favor llame inmediatamente al 911 o vaya a la sala de emergencias.  Nmeros de bper  - Dr. Kowalski: [phone]  - Dra. Moye: [phone]  - Dra. Stewart: [phone]  En caso de inclemencias del tiempo, por favor llame a nuestra lnea principal al [phone] para una actualizacin sobre el estado de cualquier retraso o cierre.  Consejos para la medicacin en dermatologa: Por favor, guarde las cajas en las que vienen los medicamentos de uso tpico para ayudarle a seguir las instrucciones sobre dnde y cmo usarlos. Las farmacias generalmente imprimen las instrucciones del medicamento slo en las cajas y no directamente en los tubos del medicamento.   Si su medicamento es muy caro, por favor, pngase en contacto con nuestra oficina llamando al [phone] y presione la opcin 4 o envenos un mensaje a travs de MyChart.   No podemos decirle cul ser su copago por los medicamentos por adelantado ya que esto es diferente dependiendo de la cobertura de su seguro. Sin embargo, es posible que podamos encontrar un medicamento sustituto a menor costo o llenar un formulario para que el  seguro cubra el medicamento que se considera necesario.   Si se requiere una autorizacin previa para que su compaa de seguros cubra su medicamento, por favor permtanos de 1 a 2 das hbiles para completar este proceso.  Los precios de los medicamentos varan con frecuencia dependiendo del lugar de dnde se surte la receta y alguna farmacias pueden ofrecer precios ms baratos.  El sitio web www.goodrx.com tiene cupones para medicamentos de diferentes farmacias. Los precios aqu no tienen en cuenta lo que podra costar con la ayuda del seguro (puede ser ms barato con su seguro), pero el sitio web puede darle el precio si no utiliz ningn seguro.  - Puede imprimir el cupn correspondiente y llevarlo con su receta a la farmacia.  - Tambin puede pasar por nuestra oficina durante el horario de atencin regular y recoger una tarjeta de cupones de GoodRx.  - Si necesita que su receta se enve electrnicamente a una farmacia diferente, informe a nuestra oficina a travs de MyChart de [place]   o por telfono llamando al [phone] y presione la opcin 4.  

## 2021-09-21 ENCOUNTER — Other Ambulatory Visit: Payer: Self-pay | Admitting: Dermatology

## 2021-09-21 DIAGNOSIS — L03818 Cellulitis of other sites: Secondary | ICD-10-CM

## 2022-02-17 ENCOUNTER — Other Ambulatory Visit: Payer: Self-pay

## 2022-02-17 ENCOUNTER — Encounter: Payer: Self-pay | Admitting: Emergency Medicine

## 2022-02-17 DIAGNOSIS — Z5321 Procedure and treatment not carried out due to patient leaving prior to being seen by health care provider: Secondary | ICD-10-CM | POA: Insufficient documentation

## 2022-02-17 DIAGNOSIS — R519 Headache, unspecified: Secondary | ICD-10-CM | POA: Insufficient documentation

## 2022-02-17 DIAGNOSIS — R110 Nausea: Secondary | ICD-10-CM | POA: Insufficient documentation

## 2022-02-17 NOTE — ED Triage Notes (Signed)
Patient ambulatory to triage with steady gait, without difficulty or distress noted; pt reports HA to temples and forehead tonight accomp by nausea

## 2022-02-18 ENCOUNTER — Emergency Department
Admission: EM | Admit: 2022-02-18 | Discharge: 2022-02-18 | Discharge status: Against Medical Advice | Payer: Self-pay | Attending: Emergency Medicine | Admitting: Emergency Medicine

## 2022-06-01 ENCOUNTER — Encounter: Payer: Self-pay | Admitting: Dermatology

## 2022-06-01 ENCOUNTER — Ambulatory Visit (INDEPENDENT_AMBULATORY_CARE_PROVIDER_SITE_OTHER): Payer: Self-pay | Admitting: Dermatology

## 2022-06-01 VITALS — BP 97/68 | HR 76

## 2022-06-01 DIAGNOSIS — A630 Anogenital (venereal) warts: Secondary | ICD-10-CM

## 2022-06-01 DIAGNOSIS — Z7189 Other specified counseling: Secondary | ICD-10-CM

## 2022-06-01 DIAGNOSIS — L739 Follicular disorder, unspecified: Secondary | ICD-10-CM

## 2022-06-01 MED ORDER — CLINDAMYCIN PHOSPHATE 1 % EX LOTN: TOPICAL_LOTION | CUTANEOUS | 6 refills | Status: DC

## 2022-06-01 NOTE — Patient Instructions (Signed)
Due to recent changes in healthcare laws, you may see results of your pathology and/or laboratory studies on MyChart before the doctors have had a chance to review them. We understand that in some cases there may be results that are confusing or concerning to you. Please understand that not all results are received at the same time and often the doctors may need to interpret multiple results in order to provide you with the best plan of care or course of treatment. Therefore, we ask that you please give us 2 business days to thoroughly review all your results before contacting the office for clarification. Should we see a critical lab result, you will be contacted sooner.   If You Need Anything After Your Visit  If you have any questions or concerns for your doctor, please call our main line at [phone] and press option 4 to reach your doctor's medical assistant. If no one answers, please leave a voicemail as directed and we will return your call as soon as possible. Messages left after 4 pm will be answered the following business day.   You may also send us a message via MyChart. We typically respond to MyChart messages within 1-2 business days.  For prescription refills, please ask your pharmacy to contact our office. Our fax number is [phone].  If you have an urgent issue when the clinic is closed that cannot wait until the next business day, you can page your doctor at the number below.    Please note that while we do our best to be available for urgent issues outside of office hours, we are not available 24/7.   If you have an urgent issue and are unable to reach us, you may choose to seek medical care at your doctor's office, retail clinic, urgent care center, or emergency room.  If you have a medical emergency, please immediately call 911 or go to the emergency department.  Pager Numbers  - Dr. Kowalski: [phone]  - Dr. Moye: [phone]  - Dr. Stewart:  [phone]  In the event of inclement weather, please call our main line at [phone] for an update on the status of any delays or closures.  Dermatology Medication Tips: Please keep the boxes that topical medications come in in order to help keep track of the instructions about where and how to use these. Pharmacies typically print the medication instructions only on the boxes and not directly on the medication tubes.   If your medication is too expensive, please contact our office at [phone] option 4 or send us a message through MyChart.   We are unable to tell what your co-pay for medications will be in advance as this is different depending on your insurance coverage. However, we may be able to find a substitute medication at lower cost or fill out paperwork to get insurance to cover a needed medication.   If a prior authorization is required to get your medication covered by your insurance company, please allow us 1-2 business days to complete this process.  Drug prices often vary depending on where the prescription is filled and some pharmacies may offer cheaper prices.  The website www.goodrx.com contains coupons for medications through different pharmacies. The prices here do not account for what the cost may be with help from insurance (it may be cheaper with your insurance), but the website can give you the price if you did not use any insurance.  - You can print the associated coupon and take it with   your prescription to the pharmacy.  - You may also stop by our office during regular business hours and pick up a GoodRx coupon card.  - If you need your prescription sent electronically to a different pharmacy, notify our office through [facility] MyChart or by phone at [phone] option 4.     Si Usted Necesita Algo Despus de Su Visita  Tambin puede enviarnos un mensaje a travs de MyChart. Por lo general respondemos a los mensajes de MyChart en el transcurso de 1 a 2  das hbiles.  Para renovar recetas, por favor pida a su farmacia que se ponga en contacto con nuestra oficina. Nuestro nmero de fax es el [phone].  Si tiene un asunto urgente cuando la clnica est cerrada y que no puede esperar hasta el siguiente da hbil, puede llamar/localizar a su doctor(a) al nmero que aparece a continuacin.   Por favor, tenga en cuenta que aunque hacemos todo lo posible para estar disponibles para asuntos urgentes fuera del horario de oficina, no estamos disponibles las 24 horas del da, los 7 das de la semana.   Si tiene un problema urgente y no puede comunicarse con nosotros, puede optar por buscar atencin mdica  en el consultorio de su doctor(a), en una clnica privada, en un centro de atencin urgente o en una sala de emergencias.  Si tiene una emergencia mdica, por favor llame inmediatamente al 911 o vaya a la sala de emergencias.  Nmeros de bper  - Dr. Kowalski: [phone]  - Dra. Moye: [phone]  - Dra. Stewart: [phone]  En caso de inclemencias del tiempo, por favor llame a nuestra lnea principal al [phone] para una actualizacin sobre el estado de cualquier retraso o cierre.  Consejos para la medicacin en dermatologa: Por favor, guarde las cajas en las que vienen los medicamentos de uso tpico para ayudarle a seguir las instrucciones sobre dnde y cmo usarlos. Las farmacias generalmente imprimen las instrucciones del medicamento slo en las cajas y no directamente en los tubos del medicamento.   Si su medicamento es muy caro, por favor, pngase en contacto con nuestra oficina llamando al [phone] y presione la opcin 4 o envenos un mensaje a travs de MyChart.   No podemos decirle cul ser su copago por los medicamentos por adelantado ya que esto es diferente dependiendo de la cobertura de su seguro. Sin embargo, es posible que podamos encontrar un medicamento sustituto a menor costo o llenar un formulario para que el  seguro cubra el medicamento que se considera necesario.   Si se requiere una autorizacin previa para que su compaa de seguros cubra su medicamento, por favor permtanos de 1 a 2 das hbiles para completar este proceso.  Los precios de los medicamentos varan con frecuencia dependiendo del lugar de dnde se surte la receta y alguna farmacias pueden ofrecer precios ms baratos.  El sitio web www.goodrx.com tiene cupones para medicamentos de diferentes farmacias. Los precios aqu no tienen en cuenta lo que podra costar con la ayuda del seguro (puede ser ms barato con su seguro), pero el sitio web puede darle el precio si no utiliz ningn seguro.  - Puede imprimir el cupn correspondiente y llevarlo con su receta a la farmacia.  - Tambin puede pasar por nuestra oficina durante el horario de atencin regular y recoger una tarjeta de cupones de GoodRx.  - Si necesita que su receta se enve electrnicamente a una farmacia diferente, informe a nuestra oficina a travs de MyChart de [facility]   o por telfono llamando al [phone] y presione la opcin 4.  

## 2022-06-01 NOTE — Progress Notes (Signed)
   Follow-Up Visit   Subjective  Marcus Gordon is a 37 y.o. male who presents for the following: follow up of folliculitis of the axillae treated in the past with clindamycin lotion, check spots on his penis.    The following portions of the chart were reviewed this encounter and updated as appropriate: medications, allergies, medical history  Review of Systems:  No other skin or systemic complaints except as noted in HPI or Assessment and Plan.  Objective  Well appearing patient in no apparent distress; mood and affect are within normal limits.  A focused examination was performed of the following areas:face, scalp   Relevant exam findings are noted in the Assessment and Plan.   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 This is only one today. Exam: verrucous papule of scrotum 1 cm. Genital area otherwise clear today.  Discussed viral / HPV (Human Papilloma Virus) etiology and risk of spread /infectivity to other areas of body as well as to other people.  Multiple treatments and methods may be required to clear warts and it is possible treatment may not be successful.  Treatment risks include discoloration; scarring and there is still potential for wart recurrence. Pt understands risk of sexual transmission and recommendation for condoms.  Treatment Plan: Destruction Procedure Note Destruction method: cryotherapy   Informed consent: discussed and consent obtained   Lesion destroyed using liquid nitrogen: Yes   Outcome: patient tolerated procedure well with no complications   Post-procedure details: wound care instructions given   Locations: scrotum  perineum # of Lesions Treated: 1  Prior to procedure, discussed risks of blister formation, small wound, skin dyspigmentation, or rare scar following cryotherapy. Recommend Vaseline ointment to treated areas while healing.    Folliculitis  Related  Medications clindamycin (CLEOCIN-T) 1 % lotion Apply to pimples on the arms  once daily until clear.  FOLLICULITIS Exam: Perifollicular erythematous papules, mild today Location:Axillae    Start Clindamycin lotion apply to axillae daily prn  Return if symptoms worsen or fail to improve.  IMarye Round, CMA, am acting as scribe for Sarina Ser, MD .   Documentation: I have reviewed the above documentation for accuracy and completeness, and I agree with the above.  Sarina Ser, MD

## 2022-11-16 ENCOUNTER — Emergency Department: Payer: Medicaid Other

## 2022-11-16 ENCOUNTER — Other Ambulatory Visit: Payer: Self-pay

## 2022-11-16 ENCOUNTER — Observation Stay
Admission: EM | Admit: 2022-11-16 | Discharge: 2022-11-17 | Disposition: A | Discharge status: Home / Self Care | Payer: Medicaid Other | Attending: Internal Medicine | Admitting: Internal Medicine

## 2022-11-16 DIAGNOSIS — F1290 Cannabis use, unspecified, uncomplicated: Secondary | ICD-10-CM | POA: Diagnosis present

## 2022-11-16 DIAGNOSIS — Z79899 Other long term (current) drug therapy: Secondary | ICD-10-CM | POA: Insufficient documentation

## 2022-11-16 DIAGNOSIS — Z8719 Personal history of other diseases of the digestive system: Secondary | ICD-10-CM

## 2022-11-16 DIAGNOSIS — R0602 Shortness of breath: Secondary | ICD-10-CM

## 2022-11-16 DIAGNOSIS — J45909 Unspecified asthma, uncomplicated: Secondary | ICD-10-CM | POA: Diagnosis not present

## 2022-11-16 DIAGNOSIS — Z1152 Encounter for screening for COVID-19: Secondary | ICD-10-CM | POA: Diagnosis not present

## 2022-11-16 DIAGNOSIS — R079 Chest pain, unspecified: Secondary | ICD-10-CM | POA: Diagnosis present

## 2022-11-16 DIAGNOSIS — G43809 Other migraine, not intractable, without status migrainosus: Principal | ICD-10-CM

## 2022-11-16 DIAGNOSIS — F17200 Nicotine dependence, unspecified, uncomplicated: Secondary | ICD-10-CM | POA: Diagnosis present

## 2022-11-16 DIAGNOSIS — F17210 Nicotine dependence, cigarettes, uncomplicated: Secondary | ICD-10-CM | POA: Insufficient documentation

## 2022-11-16 DIAGNOSIS — I4892 Unspecified atrial flutter: Principal | ICD-10-CM | POA: Insufficient documentation

## 2022-11-16 DIAGNOSIS — G43909 Migraine, unspecified, not intractable, without status migrainosus: Secondary | ICD-10-CM | POA: Diagnosis present

## 2022-11-16 LAB — COMPREHENSIVE METABOLIC PANEL
ALT: 28 U/L (ref 0–44)
AST: 27 U/L (ref 15–41)
Albumin: 3.9 g/dL (ref 3.5–5.0)
Alkaline Phosphatase: 94 U/L (ref 38–126)
Anion gap: 11 (ref 5–15)
BUN: 19 mg/dL (ref 6–20)
CO2: 22 mmol/L (ref 22–32)
Calcium: 8.6 mg/dL — ABNORMAL LOW (ref 8.9–10.3)
Chloride: 105 mmol/L (ref 98–111)
Creatinine, Ser: 1.39 mg/dL — ABNORMAL HIGH (ref 0.61–1.24)
GFR, Estimated: >60 mL/min (ref >60)
Glucose, Bld: 101 mg/dL — ABNORMAL HIGH (ref 70–99)
Potassium: 3.8 mmol/L (ref 3.5–5.1)
Sodium: 138 mmol/L (ref 135–145)
Total Bilirubin: 0.6 mg/dL (ref 0.3–1.2)
Total Protein: 7.2 g/dL (ref 6.5–8.1)

## 2022-11-16 LAB — CBC
HCT: 44.3 % (ref 39.0–52.0)
Hemoglobin: 15.1 g/dL (ref 13.0–17.0)
MCH: 32.4 pg (ref 26.0–34.0)
MCHC: 34.1 g/dL (ref 30.0–36.0)
MCV: 95.1 fL (ref 80.0–100.0)
Platelets: 280 10*3/uL (ref 150–400)
RBC: 4.66 MIL/uL (ref 4.22–5.81)
RDW: 13.5 % (ref 11.5–15.5)
WBC: 10.1 10*3/uL (ref 4.0–10.5)
nRBC: 0 % (ref 0.0–0.2)

## 2022-11-16 LAB — TROPONIN I (HIGH SENSITIVITY): Troponin I (High Sensitivity): 22 ng/L — ABNORMAL HIGH (ref <18)

## 2022-11-16 MED ORDER — PROCHLORPERAZINE EDISYLATE 10 MG/2ML IJ SOLN
5.0000 mg | Freq: Once | INTRAMUSCULAR | Status: AC
  Administered 2022-11-17: 5 mg via INTRAVENOUS
  Filled 2022-11-16: qty 2

## 2022-11-16 MED ORDER — DIPHENHYDRAMINE HCL 50 MG/ML IJ SOLN
25.0000 mg | Freq: Once | INTRAMUSCULAR | Status: AC
  Administered 2022-11-17: 25 mg via INTRAVENOUS
  Filled 2022-11-16: qty 1

## 2022-11-16 MED ORDER — SODIUM CHLORIDE 0.9 % IV BOLUS
1000.0000 mL | Freq: Once | INTRAVENOUS | Status: AC
  Administered 2022-11-17: 1000 mL via INTRAVENOUS

## 2022-11-16 NOTE — ED Provider Notes (Signed)
[facility] Provider Note    Event Date/Time   First MD Initiated Contact with Patient 11/16/22 2331     (approximate)   History   Migraine and Shortness of Breath   HPI  Marcus Gordon is a 37 y.o. male who presents to the ED from home with a chief complaint of headache, chest pain and shortness of breath.  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  History of migraines in the past.  Tylenol taken prior to arrival.  Also endorses shortness of breath associated with chest tightness.  Thinks it is coming from his smoking cigarettes as well as marijuana and probably mold in his apartment.  Also endorses nasal congestion.  Denies fever/chills, cough, abdominal pain, vomiting or diarrhea.  Denies slurred speech, confusion, extremity weakness/numbness/tingling.     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 Authorizing Provider  Adalimumab 40 MG/0.4ML PNKT Inject into the skin. Patient not taking: Reported on 05/28/2020 07/21/17   [provider]  ciprofloxacin (CILOXAN) 0.3 % ophthalmic solution Place 2 drops into the left eye every 4 (four) hours while awake. Patient not taking: Reported on 10/09/2019 12/28/16   [name], MD  clindamycin (CLEOCIN-T) 1 % lotion Apply to pimples on the arms  once daily until clear. 06/01/22   Deirdre Evener, MD  doxycycline (VIBRA-TABS) 100 MG tablet TAKE 1 TABLET BY MOUTH TWICE A DAY Patient not taking: Reported on 06/01/2022 09/21/21   Deirdre Evener, MD  imiquimod Mathis Dad) 5 % cream Apply topically at bedtime. Patient not taking: Reported on 06/01/2022 08/17/21   Deirdre Evener, MD  ketoconazole (NIZORAL) 2 % shampoo Shampoo into scalp let sit 5-10 minutes then wash out. Use 3 days per week. Patient not taking: Reported on 06/01/2022 08/17/21   Deirdre Evener, MD  mupirocin ointment Idelle Jo) 2 % Apply to affected area in groin twice daily x 1-2 weeks until improved. Patient not taking: Reported on 06/01/2022 09/03/20   Deirdre Evener, MD  ondansetron (ZOFRAN ODT) 4 MG disintegrating tablet Take 1 tablet (4 mg total) by mouth every 8 (eight) hours as needed for nausea or vomiting. Patient not taking: Reported on 05/28/2020  05/26/20   Minna Antis, MD  oxyCODONE-acetaminophen (PERCOCET) 5-325 MG tablet Take 1 tablet by mouth every 4 (four) hours as needed for moderate pain. Patient not taking: Reported on 06/01/2022 05/26/20   Minna Antis, MD  predniSONE (DELTASONE) 10 MG tablet Day 1-7: Take 4 tablets p.o. daily. Day 8-14: Take 3 tablets p.o. daily. Day 15-21: Take 2 tablets p.o. daily. Day 22-28: Take 1 tablet p.o. daily Patient not taking: Reported on 06/01/2022 05/26/20    Minna Antis, MD  Vitamin D, Ergocalciferol, (DRISDOL) 1.25 MG (50000 UNIT) CAPS capsule Take 1 capsule (50,000 Units total) by mouth every 7 (seven) days. Patient not taking: Reported on 06/01/2022 06/01/20   Toney Reil, MD     Allergies  Aspirin, Other, Remicade [infliximab], Dilaudid [hydromorphone hcl], and Morphine and codeine   Family History   Family History  Problem Relation Age of Onset   Healthy Mother    Healthy Father    Healthy Brother      Physical Exam  Triage Vital Signs: ED Triage Vitals  Encounter Vitals Group     BP 11/16/22 2158 (!) 131/110     Systolic BP Percentile --      Diastolic BP Percentile --      Pulse Rate 11/16/22 2158 (!) 50     Resp 11/16/22 2158 18     Temp 11/16/22 2158 98.2 F (36.8 C)     Temp Source 11/16/22 2158 Oral     SpO2 11/16/22 2158 98 %     Weight 11/16/22 2155 170 lb (77.1 kg)     Height 11/16/22 2155 6' (1.829 m)     Head Circumference --      Peak Flow --      Pain Score 11/16/22 2155 10     Pain Loc --      Pain Education --      Exclude from Growth Chart --     Updated Vital Signs: BP (!) 131/110   Pulse (!) 50   Temp 98.2 F (36.8 C) (Oral)   Resp 18   Ht 6' (1.829 m)   Wt 77.1 kg   SpO2 98%   BMI 23.06 kg/m    General: Awake, mild distress.  CV:  RRR.  Good peripheral perfusion. Resp:  Normal effort.  CTAB. Abd:  Nontender.  No distention.  Other:  PERRL.  EOMI.  No carotid bruits.  Supple neck without meningismus.  Alert and oriented x 3.  CN II-XII grossly intact.  5/5 motor strength and sensation all extremities.   MAE x 4.  No petechiae.   ED Results / Procedures / Treatments  Labs (all labs ordered are listed, but only abnormal results are displayed) Labs Reviewed  COMPREHENSIVE METABOLIC PANEL - Abnormal; Notable for the following components:      Result Value   Glucose, Bld 101 (*)    Creatinine, Ser 1.39 (*)    Calcium 8.6 (*)    All other components within normal  limits  TROPONIN I (HIGH SENSITIVITY) - Abnormal; Notable for the following components:   Troponin I (High Sensitivity) 22 (*)    All other components within normal limits  TROPONIN I (HIGH SENSITIVITY) - Abnormal; Notable for the following components:   Troponin I (High Sensitivity) 18 (*)    All other components within normal limits  SARS CORONAVIRUS 2 BY RT PCR  CBC  CK  URINE DRUG SCREEN, QUALITATIVE ([facility] ONLY)  URINALYSIS, ROUTINE W REFLEX MICROSCOPIC  EKG  ED ECG REPORT I, Rakel Junio J, the attending physician, personally viewed and interpreted this ECG.   Date: 11/16/2022  EKG Time: 2200  Rate: 95  Rhythm: Atrial flutter with variable block  Axis: LAD  Intervals: QTc 492  ST&T Change: Nonspecific; no STEMI Rhythm change compared with EKG dated 05/27/2020   RADIOLOGY I have independently visualized and interpreted patient's imaging studies as well as noted the radiology interpretation:  Chest x-ray: No acute cardiopulmonary process  CT head: No ICH  CTA chest: No PE, mild global cardiomegaly  Official radiology report(s): CT Angio Chest PE W/Cm &/Or Wo Cm  Result Date: 11/17/2022 CLINICAL DATA:  Low to intermediate probability pulmonary embolism, dyspnea EXAM: CT ANGIOGRAPHY CHEST WITH CONTRAST TECHNIQUE: Multidetector CT imaging of the chest was performed using the standard protocol during bolus administration of intravenous contrast. Multiplanar CT image reconstructions and MIPs were obtained to evaluate the vascular anatomy. RADIATION DOSE REDUCTION: This exam was performed according to the departmental dose-optimization program which includes automated exposure control, adjustment of the mA and/or kV according to patient size and/or use of iterative reconstruction technique. CONTRAST:  75mL OMNIPAQUE IOHEXOL 350 MG/ML SOLN COMPARISON:  None Available. FINDINGS: Cardiovascular: Adequate opacification of the pulmonary arterial tree. No intraluminal filling defect  identified to suggest acute pulmonary embolism. Central pulmonary arteries are of normal caliber. No significant coronary artery calcification. There is mild global cardiomegaly with particular enlargement of the right atrium. No pericardial effusion. No significant atherosclerotic calcification within the thoracic aorta. No aortic aneurysm. Mediastinum/Nodes: No enlarged mediastinal, hilar, or axillary lymph nodes. Thyroid gland, trachea, and esophagus demonstrate no significant findings. Lungs/Pleura: Lungs are clear. No pleural effusion or pneumothorax. Upper Abdomen: No acute abnormality. Musculoskeletal: No chest wall abnormality. No acute or significant osseous findings. Review of the MIP images confirms the above findings. IMPRESSION: 1. No pulmonary embolism. No acute intrathoracic pathology identified. 2. Mild global cardiomegaly with particular enlargement of the right atrium. Echocardiography may be helpful for further evaluation. Electronically Signed   By: Helyn Numbers M.D.   On: 11/17/2022 00:49   CT Head Wo Contrast  Result Date: 11/17/2022 CLINICAL DATA:  Headache. EXAM: CT HEAD WITHOUT CONTRAST TECHNIQUE: Contiguous axial images were obtained from the base of the skull through the vertex without intravenous contrast. RADIATION DOSE REDUCTION: This exam was performed according to the departmental dose-optimization program which includes automated exposure control, adjustment of the mA and/or kV according to patient size and/or use of iterative reconstruction technique. COMPARISON:  December 25, 2018 FINDINGS: Brain: No evidence of acute infarction, hemorrhage, hydrocephalus, extra-axial collection or mass lesion/mass effect. Vascular: No hyperdense vessel or unexpected calcification. Skull: Normal. Negative for fracture or focal lesion. Sinuses/Orbits: No acute finding. Other: None. IMPRESSION: Normal head CT. Electronically Signed   By: Aram Candela M.D.   On: 11/17/2022 00:37   DG Chest  Port 1 View  Result Date: 11/16/2022 CLINICAL DATA:  Shortness of breath EXAM: PORTABLE CHEST 1 VIEW COMPARISON:  05/26/2020 FINDINGS: The heart size and mediastinal contours are within normal limits. Both lungs are clear. The visualized skeletal structures are unremarkable. IMPRESSION: No active disease. Electronically Signed   By: Jasmine Pang M.D.   On: 11/16/2022 22:57     PROCEDURES:  Critical Care performed: Yes, see critical care procedure note(s)  CRITICAL CARE Performed by: Irean Hong   Total critical care time: 30 minutes  Critical care time was exclusive of separately billable procedures and treating other patients.  Critical care was necessary  to treat or prevent imminent or life-threatening deterioration.  Critical care was time spent personally by me on the following activities: development of treatment plan with patient and/or surrogate as well as nursing, discussions with consultants, evaluation of patient's response to treatment, examination of patient, obtaining history from patient or surrogate, ordering and performing treatments and interventions, ordering and review of laboratory studies, ordering and review of radiographic studies, pulse oximetry and re-evaluation of patient's condition.   Marland Kitchen1-3 Lead EKG Interpretation  Performed by: Irean Hong, MD Authorized by: Irean Hong, MD     Interpretation: abnormal     ECG rate:  95   ECG rate assessment: normal     Rhythm: atrial flutter     Ectopy: none     Conduction: normal   Comments:     Patient placed on cardiac monitor to evaluate for arrhythmias    MEDICATIONS ORDERED IN ED: Medications  sodium chloride 0.9 % bolus 1,000 mL (1,000 mLs Intravenous New Bag/Given 11/17/22 0007)  prochlorperazine (COMPAZINE) injection 5 mg (5 mg Intravenous Given 11/17/22 0009)  diphenhydrAMINE (BENADRYL) injection 25 mg (25 mg Intravenous Given 11/17/22 0009)  iohexol (OMNIPAQUE) 350 MG/ML injection 75 mL (75 mLs  Intravenous Contrast Given 11/17/22 0022)     IMPRESSION / MDM / ASSESSMENT AND PLAN / ED COURSE  I reviewed the triage vital signs and the nursing notes.                             37 year old male presenting with headache, chest tightness, shortness of breath, near syncope. Differential diagnosis includes, but is not limited to, ACS, aortic dissection, pulmonary embolism, cardiac tamponade, pneumothorax, pneumonia, pericarditis, myocarditis, GI-related causes including esophagitis/gastritis, and musculoskeletal chest wall pain.   I personally reviewed patient's records and note a dermatology office visit on 06/01/2022 for condyloma.  Patient's presentation is most consistent with acute presentation with potential threat to life or bodily function.  The patient is on the cardiac monitor to evaluate for evidence of arrhythmia and/or significant heart rate changes.  Laboratory results demonstrate normal WBC 10.1, AKI with creatinine 1.39, mildly elevated initial troponin of 22.  Will check repeat troponin, check CK, UDS, COVID swab.  Obtain CT head, CT a chest to evaluate for PE.  Patient allergic to aspirin and prefers not to take given his Crohn's disease; will administer Compazine/Benadryl for headache, start IV fluids.  Patient denies chest pain at this time.  Anticipate hospitalization for abnormal EKG.  Will reassess.   Clinical Course as of 11/17/22 0100  Thu Nov 17, 2022  1191 Patient sleeping; updated family member of CT imaging results and repeat troponin.  Will consult hospital services for evaluation and admission. [JS]    Clinical Course User Index [JS] Irean Hong, MD     FINAL CLINICAL IMPRESSION(S) / ED DIAGNOSES   Final diagnoses:  Other migraine without status migrainosus, not intractable  Shortness of breath  Chest pain, unspecified type  Atrial flutter, unspecified type (HCC)     Rx / DC Orders   ED Discharge Orders     None        Note:  This document  was prepared using Dragon voice recognition software and may include unintentional dictation errors.   Irean Hong, MD 11/17/22 [phone]

## 2022-11-16 NOTE — ED Triage Notes (Addendum)
Pt to ED via POV c/o migraine and SOB. Pt says migraine started today 2hrs ago. Hx of migraines in past. Tylenol taken PTA. Denies CP, dizziness, fevers. Pt also complaining of sob today. Thinks its coming from mold in apt and smoking. Has happened before

## 2022-11-16 NOTE — ED Notes (Signed)
EKG delivered to Dr. Anner Crete. No STEMI called at this time

## 2022-11-17 ENCOUNTER — Other Ambulatory Visit: Payer: Self-pay

## 2022-11-17 ENCOUNTER — Observation Stay (HOSPITAL_BASED_OUTPATIENT_CLINIC_OR_DEPARTMENT_OTHER)
Admit: 2022-11-17 | Discharge: 2022-11-17 | Disposition: A | Discharge status: Home / Self Care | Payer: Medicaid Other | Attending: Cardiovascular Disease | Admitting: Cardiovascular Disease

## 2022-11-17 ENCOUNTER — Other Ambulatory Visit (HOSPITAL_COMMUNITY): Payer: Self-pay

## 2022-11-17 ENCOUNTER — Emergency Department: Payer: Medicaid Other

## 2022-11-17 DIAGNOSIS — I4892 Unspecified atrial flutter: Principal | ICD-10-CM | POA: Diagnosis present

## 2022-11-17 DIAGNOSIS — G43909 Migraine, unspecified, not intractable, without status migrainosus: Secondary | ICD-10-CM | POA: Diagnosis present

## 2022-11-17 DIAGNOSIS — F17200 Nicotine dependence, unspecified, uncomplicated: Secondary | ICD-10-CM | POA: Diagnosis present

## 2022-11-17 DIAGNOSIS — I361 Nonrheumatic tricuspid (valve) insufficiency: Secondary | ICD-10-CM

## 2022-11-17 DIAGNOSIS — R0602 Shortness of breath: Secondary | ICD-10-CM

## 2022-11-17 DIAGNOSIS — Z8719 Personal history of other diseases of the digestive system: Secondary | ICD-10-CM

## 2022-11-17 DIAGNOSIS — R079 Chest pain, unspecified: Secondary | ICD-10-CM | POA: Diagnosis present

## 2022-11-17 DIAGNOSIS — F1290 Cannabis use, unspecified, uncomplicated: Secondary | ICD-10-CM | POA: Diagnosis present

## 2022-11-17 DIAGNOSIS — I517 Cardiomegaly: Secondary | ICD-10-CM | POA: Insufficient documentation

## 2022-11-17 HISTORY — DX: Shortness of breath: R06.02

## 2022-11-17 LAB — URINE DRUG SCREEN, QUALITATIVE (ARMC ONLY)
Amphetamines, Ur Screen: NOT DETECTED
Barbiturates, Ur Screen: NOT DETECTED
Benzodiazepine, Ur Scrn: NOT DETECTED
Cannabinoid 50 Ng, Ur ~~LOC~~: POSITIVE — AB
Cocaine Metabolite,Ur ~~LOC~~: NOT DETECTED
MDMA (Ecstasy)Ur Screen: NOT DETECTED
Methadone Scn, Ur: NOT DETECTED
Opiate, Ur Screen: NOT DETECTED
Phencyclidine (PCP) Ur S: NOT DETECTED
Tricyclic, Ur Screen: POSITIVE — AB

## 2022-11-17 LAB — URINALYSIS, ROUTINE W REFLEX MICROSCOPIC
Bilirubin Urine: NEGATIVE
Glucose, UA: NEGATIVE mg/dL
Hgb urine dipstick: NEGATIVE
Ketones, ur: NEGATIVE mg/dL
Leukocytes,Ua: NEGATIVE
Nitrite: NEGATIVE
Protein, ur: NEGATIVE mg/dL
Specific Gravity, Urine: >1.046 — ABNORMAL HIGH (ref 1.005–1.030)
pH: 5 (ref 5.0–8.0)

## 2022-11-17 LAB — HIV ANTIBODY (ROUTINE TESTING W REFLEX): HIV Screen 4th Generation wRfx: NONREACTIVE

## 2022-11-17 LAB — ECHOCARDIOGRAM COMPLETE
AR max vel: 2.63 cm2
AV Area VTI: 2.63 cm2
AV Area mean vel: 2.47 cm2
AV Mean grad: 3.3 mmHg
AV Peak grad: 6.5 mmHg
Ao pk vel: 1.28 m/s
Area-P 1/2: 3.61 cm2
Height: 72 in
MV VTI: 3.26 cm2
S' Lateral: 3.2 cm
Weight: 2720 [oz_av]

## 2022-11-17 LAB — MAGNESIUM: Magnesium: 2.4 mg/dL (ref 1.7–2.4)

## 2022-11-17 LAB — CK: Total CK: 241 U/L (ref 49–397)

## 2022-11-17 LAB — TSH: TSH: 0.432 u[IU]/mL (ref 0.350–4.500)

## 2022-11-17 LAB — SARS CORONAVIRUS 2 BY RT PCR: SARS Coronavirus 2 by RT PCR: NEGATIVE

## 2022-11-17 LAB — TROPONIN I (HIGH SENSITIVITY): Troponin I (High Sensitivity): 18 ng/L — ABNORMAL HIGH (ref <18)

## 2022-11-17 MED ORDER — POTASSIUM CHLORIDE CRYS ER 20 MEQ PO TBCR: 20.0000 meq | EXTENDED_RELEASE_TABLET | Freq: Every day | ORAL | 1 refills | Status: DC

## 2022-11-17 MED ORDER — ADALIMUMAB 40 MG/0.4ML ~~LOC~~ AJKT
40.0000 mg | AUTO-INJECTOR | SUBCUTANEOUS | 0 refills | Status: DC
  Filled 2022-11-17: qty 2, 28d supply, fill #0

## 2022-11-17 MED ORDER — ACETAMINOPHEN 325 MG PO TABS: 650.0000 mg | ORAL_TABLET | ORAL | Status: DC | PRN

## 2022-11-17 MED ORDER — NICOTINE 14 MG/24HR TD PT24: 14.0000 mg | MEDICATED_PATCH | Freq: Every day | TRANSDERMAL | Status: DC

## 2022-11-17 MED ORDER — SUMATRIPTAN SUCCINATE 50 MG PO TABS
50.0000 mg | ORAL_TABLET | ORAL | 0 refills | Status: AC
  Filled 2022-11-17: qty 9, 30d supply, fill #0

## 2022-11-17 MED ORDER — ONDANSETRON HCL 4 MG/2ML IJ SOLN
4.0000 mg | Freq: Four times a day (QID) | INTRAMUSCULAR | Status: DC | PRN
  Administered 2022-11-17: 4 mg via INTRAVENOUS
  Filled 2022-11-17: qty 2

## 2022-11-17 MED ORDER — APIXABAN 5 MG PO TABS: 5.0000 mg | ORAL_TABLET | Freq: Two times a day (BID) | ORAL | 1 refills | Status: DC

## 2022-11-17 MED ORDER — IOHEXOL 350 MG/ML SOLN
75.0000 mL | Freq: Once | INTRAVENOUS | Status: AC | PRN
  Administered 2022-11-17: 75 mL via INTRAVENOUS

## 2022-11-17 MED ORDER — APIXABAN 5 MG PO TABS
5.0000 mg | ORAL_TABLET | Freq: Two times a day (BID) | ORAL | 1 refills | Status: DC
  Filled 2022-11-17: qty 60, 30d supply, fill #0

## 2022-11-17 MED ORDER — FUROSEMIDE 40 MG PO TABS: 20.0000 mg | ORAL_TABLET | Freq: Every day | ORAL | Status: DC

## 2022-11-17 MED ORDER — FUROSEMIDE 20 MG PO TABS: 20.0000 mg | ORAL_TABLET | Freq: Every day | ORAL | 1 refills | Status: DC

## 2022-11-17 MED ORDER — SUMATRIPTAN SUCCINATE 50 MG PO TABS
50.0000 mg | ORAL_TABLET | ORAL | Status: DC | PRN
  Filled 2022-11-17: qty 1

## 2022-11-17 MED ORDER — ENOXAPARIN SODIUM 40 MG/0.4ML IJ SOSY
40.0000 mg | PREFILLED_SYRINGE | INTRAMUSCULAR | Status: DC
  Administered 2022-11-17: 40 mg via SUBCUTANEOUS
  Filled 2022-11-17: qty 0.4

## 2022-11-17 MED ORDER — ONDANSETRON HCL 4 MG PO TABS: 4.0000 mg | ORAL_TABLET | Freq: Four times a day (QID) | ORAL | Status: DC | PRN

## 2022-11-17 MED ORDER — ACETAMINOPHEN 325 MG PO TABS: 650.0000 mg | ORAL_TABLET | Freq: Four times a day (QID) | ORAL | Status: DC | PRN

## 2022-11-17 MED ORDER — POTASSIUM CHLORIDE CRYS ER 20 MEQ PO TBCR: 20.0000 meq | EXTENDED_RELEASE_TABLET | Freq: Every day | ORAL | Status: DC

## 2022-11-17 MED ORDER — NICOTINE 14 MG/24HR TD PT24
14.0000 mg | MEDICATED_PATCH | Freq: Every day | TRANSDERMAL | 1 refills | Status: DC
  Filled 2022-11-17: qty 28, 28d supply, fill #0

## 2022-11-17 MED ORDER — APIXABAN 5 MG PO TABS
5.0000 mg | ORAL_TABLET | Freq: Two times a day (BID) | ORAL | Status: DC
  Administered 2022-11-17: 5 mg via ORAL
  Filled 2022-11-17: qty 1

## 2022-11-17 MED ORDER — FUROSEMIDE 20 MG PO TABS
20.0000 mg | ORAL_TABLET | Freq: Every day | ORAL | 1 refills | Status: DC
  Filled 2022-11-17: qty 30, 30d supply, fill #0

## 2022-11-17 MED ORDER — ACETAMINOPHEN 650 MG RE SUPP: 650.0000 mg | Freq: Four times a day (QID) | RECTAL | Status: DC | PRN

## 2022-11-17 MED ORDER — FUROSEMIDE 10 MG/ML IJ SOLN
40.0000 mg | Freq: Once | INTRAMUSCULAR | Status: AC
  Administered 2022-11-17: 40 mg via INTRAVENOUS
  Filled 2022-11-17: qty 4

## 2022-11-17 MED ORDER — POTASSIUM CHLORIDE CRYS ER 20 MEQ PO TBCR
20.0000 meq | EXTENDED_RELEASE_TABLET | Freq: Every day | ORAL | 1 refills | Status: DC
  Filled 2022-11-17: qty 30, 30d supply, fill #0

## 2022-11-17 MED ORDER — POTASSIUM CHLORIDE CRYS ER 20 MEQ PO TBCR
40.0000 meq | EXTENDED_RELEASE_TABLET | Freq: Once | ORAL | Status: AC
  Administered 2022-11-17: 40 meq via ORAL
  Filled 2022-11-17: qty 2

## 2022-11-17 MED ORDER — SUMATRIPTAN SUCCINATE 50 MG PO TABS: 50.0000 mg | ORAL_TABLET | ORAL | 0 refills | Status: DC | PRN

## 2022-11-17 MED ORDER — NICOTINE 14 MG/24HR TD PT24: 14.0000 mg | MEDICATED_PATCH | Freq: Every day | TRANSDERMAL | 1 refills | Status: DC

## 2022-11-17 MED ORDER — ONDANSETRON HCL 4 MG/2ML IJ SOLN: 4.0000 mg | Freq: Four times a day (QID) | INTRAMUSCULAR | Status: DC | PRN

## 2022-11-17 NOTE — ED Notes (Signed)
Per Jae Dire canceled bed request

## 2022-11-17 NOTE — TOC Initial Note (Signed)
Transition of Care [facility]) - Initial/Assessment Note    Patient Details  Name: Marcus Gordon MRN: [id number] Date of Birth: [birth year]  [facility]) CM/SW Contact:    Marquita Palms, LCSW Phone Number: 11/17/2022, 4:28 PM  Clinical Narrative:                   CSW met with patient bedside. Pt reports he was about to get insurance with his job until he became sick. Pt agreeable to Clinic resources and indicated an understanding for applying for Medicaid. Pt reports he [facility] [place] for [facility]. Pt reports his girlfriend is coming to pick him up to go home when discharge. Expected Discharge Plan: (P) Home/Self Care Barriers to Discharge: Inadequate or no insurance   Patient Goals and CMS Choice Patient states their goals for this hospitalization and ongoing recovery are:: (P) Needing to appy for Medicaid          Expected Discharge Plan and Services       Living arrangements for the past 2 months: (P) Single Family Home Expected Discharge Date: 11/17/22                                    Prior Living Arrangements/Services Living arrangements for the past 2 months: (P) Single Family Home Lives with:: Significant Other   Do you feel safe going back to the place where you live?: Yes               Activities of Daily Living      Permission Sought/Granted                  Emotional Assessment Appearance:: Appears stated age Attitude/Demeanor/Rapport: Gracious Affect (typically observed): Calm Orientation: : Oriented to Self, Oriented to Place, Oriented to  Time, Oriented to Situation      Admission diagnosis:  New onset atrial flutter (HCC) [I48.92] Patient Active Problem List   Diagnosis Date Noted   New onset atrial flutter (HCC) 11/17/2022   Migraine 11/17/2022   Chest pain 11/17/2022   Cardiomegaly 11/17/2022   Shortness of breath 11/17/2022   Marijuana use 11/17/2022   Tobacco dependence 11/17/2022   HSV-1  (herpes simplex virus 1) infection 09/23/2015   History of Crohn's disease    Anal fissure    Benign neoplasm of descending colon    Arthritis 02/19/2015   Crohn's disease of colon (HCC) 02/19/2015   Infection with methicillin-resistant Staphylococcus aureus 02/19/2015   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 [address] [place] [address] Phone: [phone] Fax: [phone]     Social Determinants of Health (SDOH) Social History: SDOH Screenings   Tobacco Use: High Risk (11/16/2022)   SDOH Interventions:     Readmission Risk Interventions     No data to display

## 2022-11-17 NOTE — Discharge Summary (Addendum)
Physician Discharge Summary   Patient: Marcus Gordon MRN: [id number] DOB: [birth year]  Admit date:     11/16/2022  Discharge date: 11/17/22  Discharge Physician: Jonah Blue   PCP: [name], MD   Recommendations at discharge:   Take Lasix (furosemide) and potassium daily STOP smoking (marijuana and tobacco) - nicotine patch prescribed Call Dr. Lilly Cove office regarding Humira Follow up with [facility] or [facility] until you get insurance, can also get prescriptions from the [facility]  Discharge Diagnoses: Principal Problem:   New onset atrial flutter [facility]) Active Problems:   Chest pain   Migraine   History of Crohn's disease   Marijuana use   Tobacco dependence    Hospital Course:  37yo with h/o Crohn's disease previously on Humira and migraines who presented on 9/11 with headache and SOB. CTA negative for PE but did show mild global cardiomegaly with RA enlargement. He was found to be in new-onset atrial flutter.   Assessment and Plan: * New onset atrial flutter [facility]) Patient presented with chest pain and SOB, found to have new-onset aflutter Patient in a flutter, initially with controlled response but subsequently with slow ventricular response in the 40s and 50s but maintaining good blood pressures Hold off on rate control agents due to slow ventricular response  Able to ambulate in the ER without RVR, feels great Echocardiogram reassuring Eliquis recommended despite low CHA2DS2-VASc score Cardiology consulted, will plan on cardioversion in 1 month time if he remains in atrial flutter Lasix 20 mg daily with potassium 10 Close follow-up in clinic, for worsening symptoms may need to  consider TEE cardioversion  Chest pain Suspect related to symptomatic a flutter CTA chest negative for PE but showing mild global cardiomegaly with particular enlargement of the right atrium Troponin 22--> 18 with no acute ST-T wave changes Echo  reassuring Cardiology consulted   Migraine Improved with Reglan and Compazine in the ED Sumatriptan as needed  Tobacco dependence Encourage cessation.   This was discussed with the patient and should be reviewed on an ongoing basis.   Patch ordered at patient request.   Marijuana use Cessation encouraged; this should be encouraged on an ongoing basis UDS ordered and also + for TCA  History of Crohn's disease On Humira but running out of medication so has been rationing doses (no insurance) Last seen by rheumatology in 2022 Establish with [facility] or [facility] or [facility] for refills given he has no insurance F/u with Dr. Smith Mince (he may also be able to provide samples of Humira or other medication assistance)    Consultants: Cardiology   Procedures: Echocardiogram 9/12   Antibiotics: None    Pain control - [facility] Controlled Substance Reporting System database was reviewed. and patient was instructed, not to drive, operate heavy machinery, perform activities at heights, swimming or participation in water activities or provide baby-sitting services while on Pain, Sleep and Anxiety Medications; until their outpatient Physician has advised to do so again. Also recommended to not to take more than prescribed Pain, Sleep and Anxiety Medications.     Disposition: Home Diet recommendation:  Regular diet DISCHARGE MEDICATION: Allergies as of 11/17/2022       Reactions   Aspirin    GI upset   Other    Other reaction(s): Other (See Comments) Anti-inflammatory-GI upset   Remicade [infliximab]    Gastrointestinal agents   Dilaudid [hydromorphone Hcl] Rash   Itching   Morphine And Codeine Rash  Medication List     STOP taking these medications    ciprofloxacin 0.3 % ophthalmic solution Commonly known as: CILOXAN   clindamycin 1 % lotion Commonly known as: Cleocin-T   doxycycline 100 MG tablet Commonly known as:  VIBRA-TABS   imiquimod 5 % cream Commonly known as: Aldara   ketoconazole 2 % shampoo Commonly known as: NIZORAL   mupirocin ointment 2 % Commonly known as: BACTROBAN   ondansetron 4 MG disintegrating tablet Commonly known as: Zofran ODT   oxyCODONE-acetaminophen 5-325 MG tablet Commonly known as: Percocet   predniSONE 10 MG tablet Commonly known as: DELTASONE   Vitamin D (Ergocalciferol) 1.25 MG (50000 UNIT) Caps capsule Commonly known as: DRISDOL       TAKE these medications    adalimumab 40 MG/0.4ML pen Commonly known as: HUMIRA Inject 0.4 mLs (40 mg total) into the skin every 14 (fourteen) days. What changed:  how much to take when to take this   apixaban 5 MG Tabs tablet Commonly known as: ELIQUIS Take 1 tablet (5 mg total) by mouth 2 (two) times daily.   furosemide 20 MG tablet Commonly known as: LASIX Take 1 tablet (20 mg total) by mouth daily. Start taking on: November 18, 2022   nicotine 14 mg/24hr patch Commonly known as: NICODERM CQ - dosed in mg/24 hours Place 1 patch (14 mg total) onto the skin daily.   potassium chloride SA 20 MEQ tablet Commonly known as: KLOR-CON M Take 1 tablet (20 mEq total) by mouth daily. Start taking on: November 18, 2022   SUMAtriptan 50 MG tablet Commonly known as: IMITREX Take 1 tablet (50 mg total) by mouth every 2 (two) hours as needed for migraine or headache. May repeat in 2 hours if headache persists or recurs.        Discharge Exam: Filed Weights   11/16/22 2155  Weight: 77.1 kg   Subjective: Feeling ok this AM without complaint.  SOB and palpitations improved.   Objective: Vitals:   11/17/22 0905 11/17/22 1000  BP:  110/85  Pulse: 68 64  Resp: 13 18  Temp:    SpO2: 100% 99%   No intake or output data in the 24 hours ending 11/17/22 1509 Filed Weights   11/16/22 2155  Weight: 77.1 kg    Exam:  General:  Appears calm and comfortable and is in NAD Eyes:  EOMI, normal lids, iris ENT:   grossly normal hearing, lips & tongue, mmm; appropriate dentition Neck:  no LAD, masses or thyromegaly Cardiovascular:  RRR, no m/r/g. No LE edema.  Respiratory:   CTA bilaterally with no wheezes/rales/rhonchi.  Normal respiratory effort. Abdomen:  soft, NT, ND Skin:  no rash or induration seen on limited exam Musculoskeletal:  grossly normal tone BUE/BLE, good ROM, no bony abnormality Psychiatric:  grossly normal mood and affect, speech fluent and appropriate, AOx3 Neurologic:  CN 2-12 grossly intact, moves all extremities in coordinated fashion  Data Reviewed: I have reviewed the patient's lab results since admission.  Pertinent labs for today include:  UDS + TCA, THC      Condition at discharge: good  The results of significant diagnostics from this hospitalization (including imaging, microbiology, ancillary and laboratory) are listed below for reference.   Imaging Studies: ECHOCARDIOGRAM COMPLETE  Result Date: 11/17/2022    ECHOCARDIOGRAM REPORT   Patient Name:   NIRVAAN CASTIGLIA Date of Exam: 11/17/2022 Medical Rec #:  [id number]        Height:  72.0 in Accession #:    [id number]       Weight:       170.0 lb Date of Birth:  [birth year]        BSA:          1.988 m Patient Age:    37 years         BP:           110/76 mmHg Patient Gender: M                HR:           61 bpm. Exam Location:  [facility] Procedure: 2D Echo, Cardiac Doppler and Color Doppler Indications:     Atrial Flutter  History:         Patient has no prior history of Echocardiogram examinations.                  Cardiomegaly, Arrythmias:Atrial Flutter; Signs/Symptoms:Chest                  Pain.  Sonographer:     Mikki Harbor Referring Phys:  [id number] Antonieta Iba Diagnosing Phys: [name] MD IMPRESSIONS  1. Left ventricular ejection fraction, by estimation, is 55 to 60%. The left ventricle has normal function. The left ventricle has no regional wall motion abnormalities. Left ventricular diastolic parameters are  indeterminate.  2. Right ventricular systolic function is mildly reduced. The right ventricular size is moderately enlarged. There is normal pulmonary artery systolic pressure. The estimated right ventricular systolic pressure is 25.2 mmHg.  3. Right atrial size was mildly dilated.  4. The mitral valve is normal in structure. Mild mitral valve regurgitation. No evidence of mitral stenosis.  5. Tricuspid valve regurgitation is moderate.  6. The aortic valve is tricuspid. Aortic valve regurgitation is not visualized. No aortic stenosis is present.  7. The inferior vena cava is normal in size with greater than 50% respiratory variability, suggesting right atrial pressure of 3 mmHg. FINDINGS  Left Ventricle: Left ventricular ejection fraction, by estimation, is 55 to 60%. The left ventricle has normal function. The left ventricle has no regional wall motion abnormalities. The left ventricular internal cavity size was normal in size. There is  no left ventricular hypertrophy. Left ventricular diastolic parameters are indeterminate. Right Ventricle: The right ventricular size is moderately enlarged. No increase in right ventricular wall thickness. Right ventricular systolic function is mildly reduced. There is normal pulmonary artery systolic pressure. The tricuspid regurgitant velocity is 2.25 m/s, and with an assumed right atrial pressure of 5 mmHg, the estimated right ventricular systolic pressure is 25.2 mmHg. Left Atrium: Left atrial size was normal in size. Right Atrium: Right atrial size was mildly dilated. Pericardium: There is no evidence of pericardial effusion. Mitral Valve: The mitral valve is normal in structure. There is mild thickening of the mitral valve leaflet(s). Mild mitral valve regurgitation. No evidence of mitral valve stenosis. MV peak gradient, 3.2 mmHg. The mean mitral valve gradient is 1.0 mmHg. Tricuspid Valve: The tricuspid valve is normal in structure. Tricuspid valve regurgitation is  moderate . No evidence of tricuspid stenosis. Aortic Valve: The aortic valve is tricuspid. Aortic valve regurgitation is not visualized. No aortic stenosis is present. Aortic valve mean gradient measures 3.3 mmHg. Aortic valve peak gradient measures 6.5 mmHg. Aortic valve area, by VTI measures 2.63 cm. Pulmonic Valve: The pulmonic valve was normal in structure. Pulmonic valve regurgitation is not visualized. No evidence of pulmonic stenosis. Aorta: The  aortic root is normal in size and structure. Venous: The inferior vena cava is normal in size with greater than 50% respiratory variability, suggesting right atrial pressure of 3 mmHg. IAS/Shunts: No atrial level shunt detected by color flow Doppler.  LEFT VENTRICLE PLAX 2D LVIDd:         5.00 cm LVIDs:         3.20 cm LV PW:         1.20 cm LV IVS:        1.00 cm LVOT diam:     2.10 cm LV SV:         70 LV SV Index:   35 LVOT Area:     3.46 cm  RIGHT VENTRICLE RV Basal diam:  3.60 cm RV Mid diam:    3.90 cm RV S prime:     10.20 cm/s TAPSE (M-mode): 2.2 cm LEFT ATRIUM             Index        RIGHT ATRIUM           Index LA diam:        3.70 cm 1.86 cm/m   RA Area:     25.10 cm LA Vol (A2C):   51.4 ml 25.85 ml/m  RA Volume:   80.90 ml  40.69 ml/m LA Vol (A4C):   43.1 ml 21.68 ml/m LA Biplane Vol: 51.9 ml 26.10 ml/m  AORTIC VALVE                    PULMONIC VALVE AV Area (Vmax):    2.63 cm     PV Vmax:       0.74 m/s AV Area (Vmean):   2.47 cm     PV Peak grad:  2.2 mmHg AV Area (VTI):     2.63 cm AV Vmax:           127.67 cm/s AV Vmean:          81.133 cm/s AV VTI:            0.264 m AV Peak Grad:      6.5 mmHg AV Mean Grad:      3.3 mmHg LVOT Vmax:         96.80 cm/s LVOT Vmean:        57.967 cm/s LVOT VTI:          0.201 m LVOT/AV VTI ratio: 0.76  AORTA Ao Root diam: 3.50 cm MITRAL VALVE               TRICUSPID VALVE MV Area (PHT): 3.61 cm    TR Peak grad:   20.2 mmHg MV Area VTI:   3.26 cm    TR Vmax:        225.00 cm/s MV Peak grad:  3.2 mmHg MV Mean  grad:  1.0 mmHg    SHUNTS MV Vmax:       0.90 m/s    Systemic VTI:  0.20 m MV Vmean:      48.5 cm/s   Systemic Diam: 2.10 cm MV Decel Time: 210 msec MV E velocity: 79.70 cm/s [name] MD Electronically signed by [name] MD Signature Date/Time: 11/17/2022/12:00:45 PM    Final    CT Angio Chest PE W/Cm &/Or Wo Cm  Result Date: 11/17/2022 CLINICAL DATA:  Low to intermediate probability pulmonary embolism, dyspnea EXAM: CT ANGIOGRAPHY CHEST WITH CONTRAST TECHNIQUE: Multidetector CT imaging of the chest was performed using the  standard protocol during bolus administration of intravenous contrast. Multiplanar CT image reconstructions and MIPs were obtained to evaluate the vascular anatomy. RADIATION DOSE REDUCTION: This exam was performed according to the departmental dose-optimization program which includes automated exposure control, adjustment of the mA and/or kV according to patient size and/or use of iterative reconstruction technique. CONTRAST:  75mL OMNIPAQUE IOHEXOL 350 MG/ML SOLN COMPARISON:  None Available. FINDINGS: Cardiovascular: Adequate opacification of the pulmonary arterial tree. No intraluminal filling defect identified to suggest acute pulmonary embolism. Central pulmonary arteries are of normal caliber. No significant coronary artery calcification. There is mild global cardiomegaly with particular enlargement of the right atrium. No pericardial effusion. No significant atherosclerotic calcification within the thoracic aorta. No aortic aneurysm. Mediastinum/Nodes: No enlarged mediastinal, hilar, or axillary lymph nodes. Thyroid gland, trachea, and esophagus demonstrate no significant findings. Lungs/Pleura: Lungs are clear. No pleural effusion or pneumothorax. Upper Abdomen: No acute abnormality. Musculoskeletal: No chest wall abnormality. No acute or significant osseous findings. Review of the MIP images confirms the above findings. IMPRESSION: 1. No pulmonary embolism. No acute  intrathoracic pathology identified. 2. Mild global cardiomegaly with particular enlargement of the right atrium. Echocardiography may be helpful for further evaluation. Electronically Signed   By: Helyn Numbers M.D.   On: 11/17/2022 00:49   CT Head Wo Contrast  Result Date: 11/17/2022 CLINICAL DATA:  Headache. EXAM: CT HEAD WITHOUT CONTRAST TECHNIQUE: Contiguous axial images were obtained from the base of the skull through the vertex without intravenous contrast. RADIATION DOSE REDUCTION: This exam was performed according to the departmental dose-optimization program which includes automated exposure control, adjustment of the mA and/or kV according to patient size and/or use of iterative reconstruction technique. COMPARISON:  December 25, 2018 FINDINGS: Brain: No evidence of acute infarction, hemorrhage, hydrocephalus, extra-axial collection or mass lesion/mass effect. Vascular: No hyperdense vessel or unexpected calcification. Skull: Normal. Negative for fracture or focal lesion. Sinuses/Orbits: No acute finding. Other: None. IMPRESSION: Normal head CT. Electronically Signed   By: Aram Candela M.D.   On: 11/17/2022 00:37   DG Chest Port 1 View  Result Date: 11/16/2022 CLINICAL DATA:  Shortness of breath EXAM: PORTABLE CHEST 1 VIEW COMPARISON:  05/26/2020 FINDINGS: The heart size and mediastinal contours are within normal limits. Both lungs are clear. The visualized skeletal structures are unremarkable. IMPRESSION: No active disease. Electronically Signed   By: Jasmine Pang M.D.   On: 11/16/2022 22:57    Microbiology: Results for orders placed or performed during the hospital encounter of 11/16/22  SARS Coronavirus 2 by RT PCR (hospital order, performed in [facility] hospital lab) *cepheid single result test* Anterior Nasal Swab     Status: None   Collection Time: 11/17/22 12:06 AM   Specimen: Anterior Nasal Swab  Result Value Ref Range Status   SARS Coronavirus 2 by RT PCR NEGATIVE NEGATIVE  Final    Comment: (NOTE) SARS-CoV-2 target nucleic acids are NOT DETECTED.  The SARS-CoV-2 RNA is generally detectable in upper and lower respiratory specimens during the acute phase of infection. The lowest concentration of SARS-CoV-2 viral copies this assay can detect is 250 copies / mL. A negative result does not preclude SARS-CoV-2 infection and should not be used as the sole basis for treatment or other patient management decisions.  A negative result may occur with improper specimen collection / handling, submission of specimen other than nasopharyngeal swab, presence of viral mutation(s) within the areas targeted by this assay, and inadequate number of viral copies (<250 copies / mL). A  negative result must be combined with clinical observations, patient history, and epidemiological information.  Fact Sheet for Patients:   RoadLapTop.co.za  Fact Sheet for Healthcare Providers: http://kim-miller.com/  This test is not yet approved or  cleared by the [place] FDA and has been authorized for detection and/or diagnosis of SARS-CoV-2 by FDA under an Emergency Use Authorization (EUA).  This EUA will remain in effect (meaning this test can be used) for the duration of the COVID-19 declaration under Section 564(b)(1) of the Act, 21 U.S.C. section 360bbb-3(b)(1), unless the authorization is terminated or revoked sooner.  Performed at [facility], [address] [place], [place] [address]     Labs: CBC: Recent Labs  Lab 11/16/22 2159  WBC 10.1  HGB 15.1  HCT 44.3  MCV 95.1  PLT 280   Basic Metabolic Panel: Recent Labs  Lab 11/16/22 2159 11/17/22 0006  NA 138  --   K 3.8  --   CL 105  --   CO2 22  --   GLUCOSE 101*  --   BUN 19  --   CREATININE 1.39*  --   CALCIUM 8.6*  --   MG  --  2.4   Liver Function Tests: Recent Labs  Lab 11/16/22 2159  AST 27  ALT 28  ALKPHOS 94  BILITOT 0.6  PROT 7.2   ALBUMIN 3.9   CBG: No results for input(s): "GLUCAP" in the last 168 hours.  Discharge time spent: greater than 30 minutes.  Signed: Jonah Blue, MD [facility] 11/17/2022

## 2022-11-17 NOTE — Assessment & Plan Note (Addendum)
On Humira but running out of medication so has been rationing doses (no insurance) Last seen by rheumatology in 2022 Establish with [facility] or [facility] or medical management/[facility] for refills given he has no insurance F/u with Dr. Smith Mince (he may also be able to provide samples of Humira or other medication assistance)

## 2022-11-17 NOTE — Assessment & Plan Note (Addendum)
Patient presented with chest pain and SOB, found to have new-onset aflutter Patient in a flutter, initially with controlled response but subsequently with slow ventricular response in the 40s and 50s but maintaining good blood pressures Hold off on rate control agents due to slow ventricular response  Able to ambulate in the ER without RVR, feels great Echocardiogram reassuring Eliquis recommended despite low CHA2DS2-VASc score Cardiology consulted, will plan on cardioversion in 1 month time if he remains in atrial flutter Lasix 20 mg daily with potassium 10 Close follow-up in clinic, for worsening symptoms may need to  consider TEE cardioversion

## 2022-11-17 NOTE — Assessment & Plan Note (Addendum)
Suspect related to symptomatic a flutter CTA chest negative for PE but showing mild global cardiomegaly with particular enlargement of the right atrium Troponin 22--> 18 with no acute ST-T wave changes Echo reassuring Cardiology consulted

## 2022-11-17 NOTE — Consult Note (Signed)
Cardiology Consultation:   Patient ID: Marcus Gordon; [id number]; Jun 07, 1985   Admit date: 11/16/2022 Date of Consult: 11/17/2022  Primary Care Provider: [name], MD Primary Cardiologist: New - consult by Dr. Mariah Milling, MD Primary Electrophysiologist:  None   Patient Profile:   Marcus Gordon is a 37 y.o. male with a hx of Crohn's disease, migraine disorder, ongoing tobacco use since age 26, and GERD who is being seen today for the evaluation of newly diagnosed atrial flutter at the request of Dr. Para March.  History of Present Illness:   Mr. Heisser has no previous cardiac history.  He reports a history of intermittent palpitations dating back several months.  At times, these palpitations are associated with profound fatigue and dyspnea.  He presented to [facility] on the evening of 11/16/2022 in the setting of acute onset of tachypalpitations, chest tightness, dyspnea, and migraine that occurred while at work.  He reports similar symptoms 2 months ago while [place].  No presyncope or syncope.  No lower extremity swelling or progressive orthopnea.  Upon his arrival to [facility] he was mildly hypertensive with a blood pressure of 131/110 with a heart rate of 50 bpm.  EKG showed atrial flutter as outlined below.  Oxygen saturation 98% on room air.  Initial high-sensitivity troponin 22 with a delta troponin 18.  Chest x-ray showed no active disease.  Urine drug screen positive for cannabinoids and tricyclics.  TSH normal.  Potassium 3.8, BUN 19, serum creatinine 1.39 with baseline around 0.8-0.9.  CK normal.  COVID-negative.  CBC unremarkable.  CT head showed no acute intracranial abnormality.  CTA chest showed no evidence of PE with mild global cardiomegaly with particular enlargement of the right atrium.  On telemetry, he has maintained atrial flutter with variable AV block with ventricular rates in the 50s to 70s bpm.  In the ER and upon admission he has received IV fluids, and  migraine cocktail including Compazine and Benadryl.  At time of cardiology consult, he remains in atrial flutter with controlled ventricular response and is without symptoms of angina or cardiac decompensation.  No further dyspnea, chest tightness, or palpitations.    Past Medical History:  Diagnosis Date   Arthritis    hips and legs   Asthma    Crohn's disease (HCC)    External hemorrhoids    GERD (gastroesophageal reflux disease)     Past Surgical History:  Procedure Laterality Date   COLONOSCOPY     COLONOSCOPY WITH PROPOFOL N/A 08/14/2015   Procedure: COLONOSCOPY WITH PROPOFOL;  Surgeon: Midge Minium, MD;  Location: [facility];  Service: Endoscopy;  Laterality: N/A;   POLYPECTOMY  08/14/2015   Procedure: POLYPECTOMY;  Surgeon: Midge Minium, MD;  Location: [facility];  Service: Endoscopy;;     Home Meds: Prior to Admission medications   Medication Sig Start Date End Date Taking? Authorizing Provider  Adalimumab 40 MG/0.4ML PNKT Inject into the skin. Patient not taking: Reported on 05/28/2020 07/21/17   [provider]  ciprofloxacin (CILOXAN) 0.3 % ophthalmic solution Place 2 drops into the left eye every 4 (four) hours while awake. Patient not taking: Reported on 10/09/2019 12/28/16   [name], MD  clindamycin (CLEOCIN-T) 1 % lotion Apply to pimples on the arms  once daily until clear. Patient not taking: Reported on 11/17/2022 06/01/22   Deirdre Evener, MD  doxycycline (VIBRA-TABS) 100 MG tablet TAKE 1 TABLET BY MOUTH TWICE A DAY Patient not taking: Reported  on 06/01/2022 09/21/21   Deirdre Evener, MD  imiquimod Mathis Dad) 5 % cream Apply topically at bedtime. Patient not taking: Reported on 06/01/2022 08/17/21   Deirdre Evener, MD  ketoconazole (NIZORAL) 2 % shampoo Shampoo into scalp let sit 5-10 minutes then wash out. Use 3 days per week. Patient not taking: Reported on 06/01/2022 08/17/21   Deirdre Evener, MD  mupirocin ointment Idelle Jo) 2  % Apply to affected area in groin twice daily x 1-2 weeks until improved. Patient not taking: Reported on 06/01/2022 09/03/20   Deirdre Evener, MD  ondansetron (ZOFRAN ODT) 4 MG disintegrating tablet Take 1 tablet (4 mg total) by mouth every 8 (eight) hours as needed for nausea or vomiting. Patient not taking: Reported on 05/28/2020 05/26/20   Minna Antis, MD  oxyCODONE-acetaminophen (PERCOCET) 5-325 MG tablet Take 1 tablet by mouth every 4 (four) hours as needed for moderate pain. Patient not taking: Reported on 06/01/2022 05/26/20   Minna Antis, MD  predniSONE (DELTASONE) 10 MG tablet Day 1-7: Take 4 tablets p.o. daily. Day 8-14: Take 3 tablets p.o. daily. Day 15-21: Take 2 tablets p.o. daily. Day 22-28: Take 1 tablet p.o. daily Patient not taking: Reported on 06/01/2022 05/26/20   Minna Antis, MD  Vitamin D, Ergocalciferol, (DRISDOL) 1.25 MG (50000 UNIT) CAPS capsule Take 1 capsule (50,000 Units total) by mouth every 7 (seven) days. Patient not taking: Reported on 06/01/2022 06/01/20   Toney Reil, MD    Inpatient Medications: Scheduled Meds:  enoxaparin (LOVENOX) injection  40 mg Subcutaneous Q24H   Continuous Infusions:  PRN Meds: acetaminophen **OR** acetaminophen, ondansetron **OR** ondansetron (ZOFRAN) IV, SUMAtriptan  Allergies:   Allergies  Allergen Reactions   Aspirin     GI upset   Other     Other reaction(s): Other (See Comments) Anti-inflammatory-GI upset   Remicade [Infliximab]     Gastrointestinal agents   Dilaudid [Hydromorphone Hcl] Rash    Itching   Morphine And Codeine Rash    Social History:   Social History   Socioeconomic History   Marital status: Single    Spouse name: Not on file   Number of children: Not on file   Years of education: Not on file   Highest education level: Not on file  Occupational History   Not on file  Tobacco Use   Smoking status: Every Day    Current packs/day: 1.00    Average packs/day: 1 pack/day  for 13.0 years (13.0 ttl pk-yrs)    Types: Cigarettes   Smokeless tobacco: Never  Vaping Use   Vaping status: Never Used  Substance and Sexual Activity   Alcohol use: Yes    Comment: occ   Drug use: Yes    Types: Marijuana   Sexual activity: Not on file  Other Topics Concern   Not on file  Social History Narrative   Not on file   Social Determinants of Health   Financial Resource Strain: Not on file  Food Insecurity: Not on file  Transportation Needs: Not on file  Physical Activity: Not on file  Stress: Not on file  Social Connections: Not on file  Intimate Partner Violence: Not on file     Family History:   Family History  Problem Relation Age of Onset   Healthy Mother    Healthy Father    Healthy Brother     ROS:  Review of Systems  Constitutional:  Positive for malaise/fatigue. Negative for chills, diaphoresis, fever and weight loss.  HENT:  Negative for congestion.   Eyes:  Negative for discharge and redness.  Respiratory:  Positive for shortness of breath. Negative for cough, sputum production and wheezing.   Cardiovascular:  Positive for chest pain and palpitations. Negative for orthopnea, claudication, leg swelling and PND.  Gastrointestinal:  Negative for abdominal pain, heartburn, nausea and vomiting.  Musculoskeletal:  Negative for falls and myalgias.  Skin:  Negative for rash.  Neurological:  Negative for dizziness, tingling, tremors, sensory change, speech change, focal weakness, loss of consciousness and weakness.  Endo/Heme/Allergies:  Does not bruise/bleed easily.  Psychiatric/Behavioral:  Negative for substance abuse. The patient is not nervous/anxious.   All other systems reviewed and are negative.     Physical Exam/Data:   Vitals:   11/17/22 0603 11/17/22 0904 11/17/22 0905 11/17/22 1000  BP:  95/60  110/85  Pulse:   68 64  Resp:  16 13 18   Temp: 97.9 F (36.6 C)     TempSrc: Oral     SpO2:   100% 99%  Weight:      Height:       No  intake or output data in the 24 hours ending 11/17/22 1158 Filed Weights   11/16/22 2155  Weight: 77.1 kg   Body mass index is 23.06 kg/m.   Physical Exam: General: Well developed, well nourished, in no acute distress. Head: Normocephalic, atraumatic, sclera non-icteric, no xanthomas, nares without discharge.  Neck: Negative for carotid bruits. JVD not elevated. Lungs: Clear bilaterally to auscultation without wheezes, rales, or rhonchi. Breathing is unlabored. Heart: Irregular with S1 S2. No murmurs, rubs, or gallops appreciated. Abdomen: Soft, non-tender, non-distended with normoactive bowel sounds. No hepatomegaly. No rebound/guarding. No obvious abdominal masses. Msk:  Strength and tone appear normal for age. Extremities: No clubbing or cyanosis. No edema. Distal pedal pulses are 2+ and equal bilaterally. Neuro: Alert and oriented X 3. No facial asymmetry. No focal deficit. Moves all extremities spontaneously. Psych:  Responds to questions appropriately with a normal affect.   EKG:  The EKG was personally reviewed and demonstrates: 11/16/2022 - Atrial flutter with variable AV block, 95 bpm, nonspecific ST-T changes.  11/17/2022 -atrial flutter with slow ventricular response, 51 bpm, nonspecific ST-T changes Telemetry:  Telemetry was personally reviewed and demonstrates: Atrial flutter with variable AV block with ventricular rates in the 50s to 70s bpm  Weights: Filed Weights   11/16/22 2155  Weight: 77.1 kg    Relevant CV Studies:  2D echo 11/17/2022: 1. Left ventricular ejection fraction, by estimation, is 55 to 60%. The  left ventricle has normal function. The left ventricle has no regional  wall motion abnormalities. Left ventricular diastolic parameters are  indeterminate.   2. Right ventricular systolic function is mildly reduced. The right  ventricular size is moderately enlarged. There is normal pulmonary artery  systolic pressure. The estimated right ventricular  systolic pressure is  25.2 mmHg.   3. Right atrial size was mildly dilated.   4. The mitral valve is normal in structure. Mild mitral valve  regurgitation. No evidence of mitral stenosis.   5. Tricuspid valve regurgitation is moderate.   6. The aortic valve is tricuspid. Aortic valve regurgitation is not  visualized. No aortic stenosis is present.   7. The inferior vena cava is normal in size with greater than 50%  respiratory variability, suggesting right atrial pressure of 3 mmHg.   Laboratory Data:  Chemistry Recent Labs  Lab 11/16/22 2159  NA 138  K 3.8  CL  105  CO2 22  GLUCOSE 101*  BUN 19  CREATININE 1.39*  CALCIUM 8.6*  GFRNONAA >60  ANIONGAP 11    Recent Labs  Lab 11/16/22 2159  PROT 7.2  ALBUMIN 3.9  AST 27  ALT 28  ALKPHOS 94  BILITOT 0.6   Hematology Recent Labs  Lab 11/16/22 2159  WBC 10.1  RBC 4.66  HGB 15.1  HCT 44.3  MCV 95.1  MCH 32.4  MCHC 34.1  RDW 13.5  PLT 280   Cardiac EnzymesNo results for input(s): "TROPONINI" in the last 168 hours. No results for input(s): "TROPIPOC" in the last 168 hours.  BNPNo results for input(s): "BNP", "PROBNP" in the last 168 hours.  DDimer No results for input(s): "DDIMER" in the last 168 hours.  Radiology/Studies:  CT Angio Chest PE W/Cm &/Or Wo Cm  Result Date: 11/17/2022 IMPRESSION: 1. No pulmonary embolism. No acute intrathoracic pathology identified. 2. Mild global cardiomegaly with particular enlargement of the right atrium. Echocardiography may be helpful for further evaluation. Electronically Signed   By: Helyn Numbers M.D.   On: 11/17/2022 00:49   CT Head Wo Contrast  Result Date: 11/17/2022 IMPRESSION: Normal head CT. Electronically Signed   By: Aram Candela M.D.   On: 11/17/2022 00:37   DG Chest Port 1 View  Result Date: 11/16/2022 IMPRESSION: No active disease. Electronically Signed   By: Jasmine Pang M.D.   On: 11/16/2022 22:57    Assessment and Plan:   1.  Newly diagnosed  atrial flutter: -Uncertain chronicity given history of several months of intermittent palpitations -With improvement in ventricular rates, he is asymptomatic -Not currently requiring AV nodal blocking medication with slow ventricular response noted -TSH normal, potassium normal, check magnesium -Echo with preserved LV systolic function with normal wall motion with mildly reduced RV systolic function and moderately large RV cavity size with normal RVSP and mildly dilated right atrium -CHA2DS2-VASc 0 -Start apixaban 5 mg twice daily -Ambulated this afternoon to assess for controlled ventricular response and symptoms -If he is asymptomatic with this, would recommend pursuing outpatient DCCV after he has been adequately anticoagulated without interruption for a minimum of 4 weeks -However, if his ventricular rates are difficult to control with ambulation, or if he is not symptomatic with this, he would require TEE guided DCCV prior to discharge -Recommend outpatient follow-up with EP for consideration of ablation  2.  Elevated high-sensitivity troponin: -Minimally elevated and flat trending, not consistent with ACS -Echo with preserved LV systolic function and normal wall motion -No plans for inpatient ischemic evaluation  3.  Crohn's disease: -Names of local free clinics and medication management were provided to patient for outpatient assistance       For questions or updates, please contact [facility] Please consult [email or web address] for contact info under Cardiology/STEMI.   Signed, Eula Listen, PA-C [facility] Pager: [phone] 11/17/2022, 11:58 AM

## 2022-11-17 NOTE — Assessment & Plan Note (Signed)
-  Encourage cessation.   °-This was discussed with the patient and should be reviewed on an ongoing basis.   °-Patch ordered at patient request. °

## 2022-11-17 NOTE — Assessment & Plan Note (Addendum)
Improved with Reglan and Compazine in the ED Sumatriptan as needed

## 2022-11-17 NOTE — Assessment & Plan Note (Signed)
Cessation encouraged; this should be encouraged on an ongoing basis UDS ordered and also + for TCA

## 2022-11-17 NOTE — Progress Notes (Signed)
*  PRELIMINARY RESULTS* Echocardiogram 2D Echocardiogram has been performed.  Marcus Gordon 11/17/2022, 10:56 AM

## 2022-11-17 NOTE — H&P (Signed)
History and Physical    Patient: Marcus Gordon:[id number] DOB: 29-Apr-1985 DOA: 11/16/2022 DOS: the patient was seen and examined on 11/17/2022 PCP: [name], MD  Patient coming from: Home  Chief Complaint:  Chief Complaint  Patient presents with   Migraine   Shortness of Breath    HPI: DONTAI Gordon is a 37 y.o. male with medical history significant for Crohn's disease previously on Humira, migraines, onset about a year ago who presents to the ED for evaluation of shortness of breath and a headache.  Most of the history is given by his girlfriend at the bedside as patient is somnolent from medications administered in the ED.  According to the girlfriend he was at work when he developed shortness of breath and asked her to come and pick him up.  Shortly thereafter he developed a severe migraine.  He had some chest discomfort with his shortness of breath.  His migraine was typical for him and did not improve with Tylenol.  He had no cough fever or chills.  ED course and data review: BP 131/110 with pulse 50, afebrile and O2 sat 98% on room air. Labs: Troponin 22--> 18 CBC WNL, CMP with creatinine 1.39 with a GFR> 60.  CK2 41. COVID and UA pending EKG #1, personally viewed and interpreted showing a flutter at 95 EKG #2 a flutter with rate of 50 CTA chest negative for PE but showing cardiomegaly as follows: IMPRESSION: 1. No pulmonary embolism. No acute intrathoracic pathology identified. 2. Mild global cardiomegaly with particular enlargement of the right atrium. Echocardiography may be helpful for further evaluation.  CT head normal  Patient treated with an NS bolus and given Compazine and Benadryl for headache with improvement Hospitalist consulted for admission for symptomatic new onset atrial flutter.     Review of Systems: As mentioned in the history of present illness. All other systems reviewed and are negative.  Past Medical History:  Diagnosis Date    Arthritis    hips and legs   Asthma    Crohn's disease (HCC)    External hemorrhoids    GERD (gastroesophageal reflux disease)    Past Surgical History:  Procedure Laterality Date   COLONOSCOPY     COLONOSCOPY WITH PROPOFOL N/A 08/14/2015   Procedure: COLONOSCOPY WITH PROPOFOL;  Surgeon: Midge Minium, MD;  Location: [facility];  Service: Endoscopy;  Laterality: N/A;   POLYPECTOMY  08/14/2015   Procedure: POLYPECTOMY;  Surgeon: Midge Minium, MD;  Location: [facility];  Service: Endoscopy;;   Social History:  reports that he has been smoking cigarettes. He has a 13 pack-year smoking history. He has never used smokeless tobacco. He reports current alcohol use. He reports current drug use. Drug: Marijuana.  Allergies  Allergen Reactions   Aspirin     GI upset   Other     Other reaction(s): Other (See Comments) Anti-inflammatory-GI upset   Remicade [Infliximab]     Gastrointestinal agents   Dilaudid [Hydromorphone Hcl] Rash    Itching   Morphine And Codeine Rash    Family History  Problem Relation Age of Onset   Healthy Mother    Healthy Father    Healthy Brother     Prior to Admission medications   Medication Sig Start Date End Date Taking? Authorizing Provider  Adalimumab 40 MG/0.4ML PNKT Inject into the skin. Patient not taking: Reported on 05/28/2020 07/21/17   [provider]  ciprofloxacin (CILOXAN) 0.3 % ophthalmic solution Place 2 drops  into the left eye every 4 (four) hours while awake. Patient not taking: Reported on 10/09/2019 12/28/16   [name], MD  clindamycin (CLEOCIN-T) 1 % lotion Apply to pimples on the arms  once daily until clear. 06/01/22   Deirdre Evener, MD  doxycycline (VIBRA-TABS) 100 MG tablet TAKE 1 TABLET BY MOUTH TWICE A DAY Patient not taking: Reported on 06/01/2022 09/21/21   Deirdre Evener, MD  imiquimod Mathis Dad) 5 % cream Apply topically at bedtime. Patient not taking: Reported on 06/01/2022 08/17/21   Deirdre Evener, MD  ketoconazole (NIZORAL) 2 % shampoo Shampoo into scalp let sit 5-10 minutes then wash out. Use 3 days per week. Patient not taking: Reported on 06/01/2022 08/17/21   Deirdre Evener, MD  mupirocin ointment Idelle Jo) 2 % Apply to affected area in groin twice daily x 1-2 weeks until improved. Patient not taking: Reported on 06/01/2022 09/03/20   Deirdre Evener, MD  ondansetron (ZOFRAN ODT) 4 MG disintegrating tablet Take 1 tablet (4 mg total) by mouth every 8 (eight) hours as needed for nausea or vomiting. Patient not taking: Reported on 05/28/2020 05/26/20   Minna Antis, MD  oxyCODONE-acetaminophen (PERCOCET) 5-325 MG tablet Take 1 tablet by mouth every 4 (four) hours as needed for moderate pain. Patient not taking: Reported on 06/01/2022 05/26/20   Minna Antis, MD  predniSONE (DELTASONE) 10 MG tablet Day 1-7: Take 4 tablets p.o. daily. Day 8-14: Take 3 tablets p.o. daily. Day 15-21: Take 2 tablets p.o. daily. Day 22-28: Take 1 tablet p.o. daily Patient not taking: Reported on 06/01/2022 05/26/20   Minna Antis, MD  Vitamin D, Ergocalciferol, (DRISDOL) 1.25 MG (50000 UNIT) CAPS capsule Take 1 capsule (50,000 Units total) by mouth every 7 (seven) days. Patient not taking: Reported on 06/01/2022 06/01/20   Toney Reil, MD    Physical Exam: Vitals:   11/16/22 2155 11/16/22 2158  BP:  (!) 131/110  Pulse:  (!) 50  Resp:  18  Temp:  98.2 F (36.8 C)  TempSrc:  Oral  SpO2:  98%  Weight: 77.1 kg   Height: 6' (1.829 m)    Physical Exam Vitals and nursing note reviewed.  Constitutional:      General: He is sleeping. He is not in acute distress. HENT:     Head: Normocephalic and atraumatic.  Cardiovascular:     Rate and Rhythm: Bradycardia present. Rhythm irregular.     Heart sounds: Normal heart sounds.  Pulmonary:     Effort: Pulmonary effort is normal.     Breath sounds: Normal breath sounds.  Abdominal:     Palpations: Abdomen is soft.      Tenderness: There is no abdominal tenderness.  Neurological:     Mental Status: He is easily aroused. Mental status is at baseline.     Labs on Admission: I have personally reviewed following labs and imaging studies  CBC: Recent Labs  Lab 11/16/22 2159  WBC 10.1  HGB 15.1  HCT 44.3  MCV 95.1  PLT 280   Basic Metabolic Panel: Recent Labs  Lab 11/16/22 2159  NA 138  K 3.8  CL 105  CO2 22  GLUCOSE 101*  BUN 19  CREATININE 1.39*  CALCIUM 8.6*   GFR: Estimated Creatinine Clearance: 79.3 mL/min (A) (by C-G formula based on SCr of 1.39 mg/dL (H)). Liver Function Tests: Recent Labs  Lab 11/16/22 2159  AST 27  ALT 28  ALKPHOS 94  BILITOT 0.6  PROT 7.2  ALBUMIN 3.9   No results for input(s): "LIPASE", "AMYLASE" in the last 168 hours. No results for input(s): "AMMONIA" in the last 168 hours. Coagulation Profile: No results for input(s): "INR", "PROTIME" in the last 168 hours. Cardiac Enzymes: Recent Labs  Lab 11/17/22 0006  CKTOTAL 241   BNP (last 3 results) No results for input(s): "PROBNP" in the last 8760 hours. HbA1C: No results for input(s): "HGBA1C" in the last 72 hours. CBG: No results for input(s): "GLUCAP" in the last 168 hours. Lipid Profile: No results for input(s): "CHOL", "HDL", "LDLCALC", "TRIG", "CHOLHDL", "LDLDIRECT" in the last 72 hours. Thyroid Function Tests: No results for input(s): "TSH", "T4TOTAL", "FREET4", "T3FREE", "THYROIDAB" in the last 72 hours. Anemia Panel: No results for input(s): "VITAMINB12", "FOLATE", "FERRITIN", "TIBC", "IRON", "RETICCTPCT" in the last 72 hours. Urine analysis:    Component Value Date/Time   COLORURINE YELLOW (A) 05/26/2020 1340   APPEARANCEUR HAZY (A) 05/26/2020 1340   APPEARANCEUR Clear 04/09/2014 0220   LABSPEC 1.023 05/26/2020 1340   LABSPEC 1.030 04/09/2014 0220   PHURINE 5.0 05/26/2020 1340   GLUCOSEU NEGATIVE 05/26/2020 1340   GLUCOSEU Negative 04/09/2014 0220   HGBUR NEGATIVE 05/26/2020 1340    BILIRUBINUR NEGATIVE 05/26/2020 1340   BILIRUBINUR Negative 09/22/2015 1242   BILIRUBINUR Negative 04/09/2014 0220   KETONESUR 5 (A) 05/26/2020 1340   PROTEINUR 30 (A) 05/26/2020 1340   UROBILINOGEN 0.2 09/22/2015 1242   NITRITE NEGATIVE 05/26/2020 1340   LEUKOCYTESUR NEGATIVE 05/26/2020 1340   LEUKOCYTESUR Negative 04/09/2014 0220    Radiological Exams on Admission: CT Angio Chest PE W/Cm &/Or Wo Cm  Result Date: 11/17/2022 CLINICAL DATA:  Low to intermediate probability pulmonary embolism, dyspnea EXAM: CT ANGIOGRAPHY CHEST WITH CONTRAST TECHNIQUE: Multidetector CT imaging of the chest was performed using the standard protocol during bolus administration of intravenous contrast. Multiplanar CT image reconstructions and MIPs were obtained to evaluate the vascular anatomy. RADIATION DOSE REDUCTION: This exam was performed according to the departmental dose-optimization program which includes automated exposure control, adjustment of the mA and/or kV according to patient size and/or use of iterative reconstruction technique. CONTRAST:  75mL OMNIPAQUE IOHEXOL 350 MG/ML SOLN COMPARISON:  None Available. FINDINGS: Cardiovascular: Adequate opacification of the pulmonary arterial tree. No intraluminal filling defect identified to suggest acute pulmonary embolism. Central pulmonary arteries are of normal caliber. No significant coronary artery calcification. There is mild global cardiomegaly with particular enlargement of the right atrium. No pericardial effusion. No significant atherosclerotic calcification within the thoracic aorta. No aortic aneurysm. Mediastinum/Nodes: No enlarged mediastinal, hilar, or axillary lymph nodes. Thyroid gland, trachea, and esophagus demonstrate no significant findings. Lungs/Pleura: Lungs are clear. No pleural effusion or pneumothorax. Upper Abdomen: No acute abnormality. Musculoskeletal: No chest wall abnormality. No acute or significant osseous findings. Review of the  MIP images confirms the above findings. IMPRESSION: 1. No pulmonary embolism. No acute intrathoracic pathology identified. 2. Mild global cardiomegaly with particular enlargement of the right atrium. Echocardiography may be helpful for further evaluation. Electronically Signed   By: Helyn Numbers M.D.   On: 11/17/2022 00:49   CT Head Wo Contrast  Result Date: 11/17/2022 CLINICAL DATA:  Headache. EXAM: CT HEAD WITHOUT CONTRAST TECHNIQUE: Contiguous axial images were obtained from the base of the skull through the vertex without intravenous contrast. RADIATION DOSE REDUCTION: This exam was performed according to the departmental dose-optimization program which includes automated exposure control, adjustment of the mA and/or kV according to patient size and/or use of iterative reconstruction technique. COMPARISON:  December 25, 2018  FINDINGS: Brain: No evidence of acute infarction, hemorrhage, hydrocephalus, extra-axial collection or mass lesion/mass effect. Vascular: No hyperdense vessel or unexpected calcification. Skull: Normal. Negative for fracture or focal lesion. Sinuses/Orbits: No acute finding. Other: None. IMPRESSION: Normal head CT. Electronically Signed   By: Aram Candela M.D.   On: 11/17/2022 00:37   DG Chest Port 1 View  Result Date: 11/16/2022 CLINICAL DATA:  Shortness of breath EXAM: PORTABLE CHEST 1 VIEW COMPARISON:  05/26/2020 FINDINGS: The heart size and mediastinal contours are within normal limits. Both lungs are clear. The visualized skeletal structures are unremarkable. IMPRESSION: No active disease. Electronically Signed   By: Jasmine Pang M.D.   On: 11/16/2022 22:57     Data Reviewed: Relevant notes from primary care and specialist visits, past discharge summaries as available in EHR, including Care Everywhere. Prior diagnostic testing as pertinent to current admission diagnoses Updated medications and problem lists for reconciliation ED course, including vitals, labs,  imaging, treatment and response to treatment Triage notes, nursing and pharmacy notes and ED provider's notes Notable results as noted in HPI   Assessment and Plan: * New onset atrial flutter (HCC) Patient in a flutter, initially with controlled response but subsequently with slow ventricular response in the 40s and 50s but maintaining good blood pressures Hold off on rate control agents due to slow ventricular response  echocardiogram No anticoagulation due to low CHA2DS2-VASc score Cardiology consult  Chest pain Suspect related to symptomatic a flutter CTA chest negative for PE but showing mild global cardiomegaly with particular enlargement of the right atrium Troponin 22--> 18 with no acute ST-T wave changes Cardiology consult   Cardiomegaly Echocardiogram  Migraine Improved with Reglan and Compazine in the ED Will repeat for recurrence Sumatriptan as needed  History of Crohn's disease Previously on Humira.  Last seen by rheumatology in 2022 No acute issues at this time    DVT prophylaxis: Lovenox  Consults: cardiology [facility]  Advance Care Planning: full code  Family Communication: none  Disposition Plan: Back to previous home environment  Severity of Illness: Observation  Author: Andris Baumann, MD 11/17/2022 1:33 AM  For on call review [email or web address].

## 2022-11-17 NOTE — Assessment & Plan Note (Deleted)
Echocardiogram

## 2022-11-18 ENCOUNTER — Telehealth: Payer: Self-pay

## 2022-11-18 NOTE — Transitions of Care (Post Inpatient/ED Visit) (Unsigned)
11/18/2022  Name: Marcus Gordon MRN: [id number] DOB: Mar 20, 1985  Today's TOC FU Call Status: Today's TOC FU Call Status:: Unsuccessful Call (1st Attempt) Unsuccessful Call (1st Attempt) Date: 11/18/22  Attempted to reach the patient regarding the most recent Inpatient/ED visit.  Follow Up Plan: Additional outreach attempts will be made to reach the patient to complete the Transitions of Care (Post Inpatient/ED visit) call.   Signature Karena Addison, LPN [facility] Nurse Health Advisor Direct Dial [phone]

## 2022-11-22 NOTE — Transitions of Care (Post Inpatient/ED Visit) (Signed)
11/22/2022  Name: Marcus Gordon MRN: [id number] DOB: 1985/09/06  Today's TOC FU Call Status: Today's TOC FU Call Status:: Successful TOC FU Call Completed Unsuccessful Call (1st Attempt) Date: 11/18/22 [facility] FU Call Complete Date: 11/22/22 Patient's Name and Date of Birth confirmed.  Transition Care Management Follow-up Telephone Call Date of Discharge: 11/17/22 Discharge Facility: [facility]) Type of Discharge: Inpatient Admission Primary Inpatient Discharge Diagnosis:: migraine How have you been since you were released from the hospital?: Better Any questions or concerns?: No  Items Reviewed: Did you receive and understand the discharge instructions provided?: Yes Medications obtained,verified, and reconciled?: Yes (Medications Reviewed) Any new allergies since your discharge?: No Dietary orders reviewed?: Yes Do you have support at home?: No  Medications Reviewed Today: Medications Reviewed Today     Reviewed by Karena Addison, LPN (Licensed Practical Nurse) on 11/22/22 at 1552  Med List Status: <None>   Medication Order Taking? Sig Documenting Provider Last Dose Status Informant  adalimumab (HUMIRA) 40 MG/0.4ML pen [id number]  Inject 0.4 mLs (40 mg total) into the skin every 14 (fourteen) days. Jonah Blue, MD  Active   apixaban (ELIQUIS) 5 MG TABS tablet [id number]  Take 1 tablet (5 mg total) by mouth 2 (two) times daily. Jonah Blue, MD  Active   furosemide (LASIX) 20 MG tablet [id number]  Take 1 tablet (20 mg total) by mouth daily. Jonah Blue, MD  Active   nicotine (NICODERM CQ - DOSED IN MG/24 HOURS) 14 mg/24hr patch [id number]  Place 1 patch (14 mg total) onto the skin daily. Jonah Blue, MD  Active   potassium chloride SA (KLOR-CON M) 20 MEQ tablet [id number]  Take 1 tablet (20 mEq total) by mouth daily. Jonah Blue, MD  Active   SUMAtriptan (IMITREX) 50 MG tablet [id number]  Take 1 tablet (50 mg total) by mouth daily  as needed for migraine or headache. May repeat in 2 hours if headache persists or recurs. Jonah Blue, MD  Active             Home Care and Equipment/Supplies: Were Home Health Services Ordered?: NA Any new equipment or medical supplies ordered?: NA  Functional Questionnaire: Do you need assistance with bathing/showering or dressing?: No Do you need assistance with meal preparation?: No Do you need assistance with eating?: No Do you have difficulty maintaining continence: No Do you need assistance with getting out of bed/getting out of a chair/moving?: No Do you have difficulty managing or taking your medications?: No  Follow up appointments reviewed: PCP Follow-up appointment confirmed?: No (no avail appts,  sent message to staff to schedule) MD Provider Line Number:[phone] Given: No Specialist Hospital Follow-up appointment confirmed?: NA Do you need transportation to your follow-up appointment?: No Do you understand care options if your condition(s) worsen?: Yes-patient verbalized understanding    SIGNATURE Karena Addison, LPN [facility] Nurse Health Advisor Direct Dial [phone]

## 2022-11-29 ENCOUNTER — Ambulatory Visit: Payer: Self-pay

## 2022-11-29 NOTE — Telephone Encounter (Signed)
  Chief Complaint: SOB Symptoms: lightheaded Frequency: ongoing Pertinent Negatives: Patient denies  Disposition: [x] ED /[] Urgent Care (no appt availability in office) / [] Appointment(In office/virtual)/ []  [facility]/ [] Home Care/ [] Refused Recommended Disposition /[] [place] [facility]/ []  Follow-up with PCP Additional Notes: Pt states that he has intermittent SOB and feels lightheaded with exertion. Pt will return to ED for care.  Reason for Disposition  [1] MODERATE difficulty breathing (e.g., speaks in phrases, SOB even at rest, pulse 100-120) AND [2] NEW-onset or WORSE than normal  Answer Assessment - Initial Assessment Questions 1. RESPIRATORY STATUS: "Describe your breathing?" (e.g., wheezing, shortness of breath, unable to speak, severe coughing)      Lightheaded 2. ONSET: "When did this breathing problem begin?"      A couple of months 3. PATTERN "Does the difficult breathing come and go, or has it been constant since it started?"      Comes and goes 4. SEVERITY: "How bad is your breathing?" (e.g., mild, moderate, severe)    - MILD: No SOB at rest, mild SOB with walking, speaks normally in sentences, can lie down, no retractions, pulse < 100.    - MODERATE: SOB at rest, SOB with minimal exertion and prefers to sit, cannot lie down flat, speaks in phrases, mild retractions, audible wheezing, pulse 100-120.    - SEVERE: Very SOB at rest, speaks in single words, struggling to breathe, sitting hunched forward, retractions, pulse > 120      Mild moderate 5. RECURRENT SYMPTOM: "Have you had difficulty breathing before?" If Yes, ask: "When was the last time?" and "What happened that time?"      yes 6. CARDIAC HISTORY: "Do you have any history of heart disease?" (e.g., heart attack, angina, bypass surgery, angioplasty)      Heart issue found at recent ED visit  Protocols used: Breathing Difficulty-A-AH

## 2023-02-03 IMAGING — CT CT ABD-PELV W/ CM
2 of 5 series · 16 of 46 positions shown, 18 images · IV contrast (APPLIED)
Comparison: 10/01/2013

CLINICAL DATA: Acute onset of lower abdominal pain. Rectal
bleeding. 18 lb weight loss. Crohn disease.

EXAM:
CT ABDOMEN AND PELVIS WITH CONTRAST
TECHNIQUE: Multidetector CT imaging of the abdomen and pelvis was performed
using the standard protocol following bolus administration of
intravenous contrast.
CONTRAST:  100mL OMNIPAQUE IOHEXOL 300 MG/ML  SOLN

[Series 4: routine abd/pel with · axial · 0.68mm/px · z∈[-506,-106]mm · 13 of 88 slices shown, 15 images]
[im 4/88  soft-tissue]
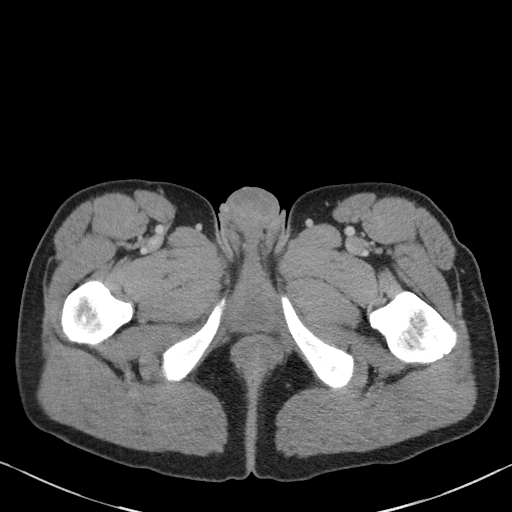
[im 4/88  bone]
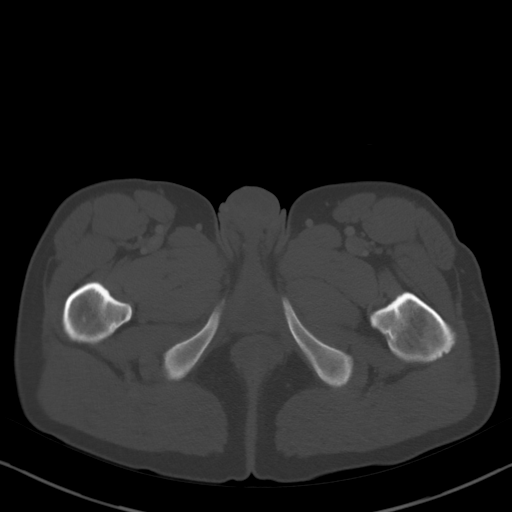
[im 11/88  soft-tissue]
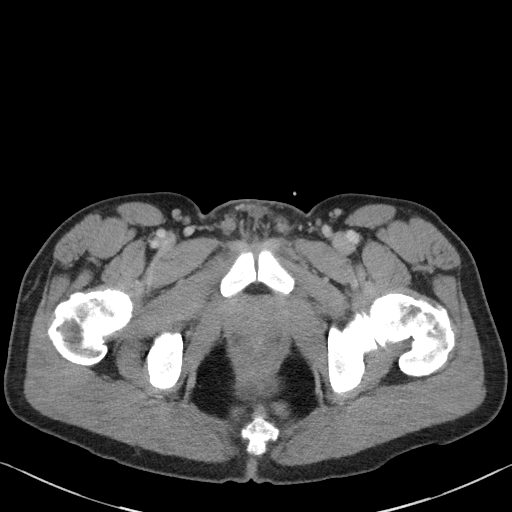
[im 18/88  soft-tissue]
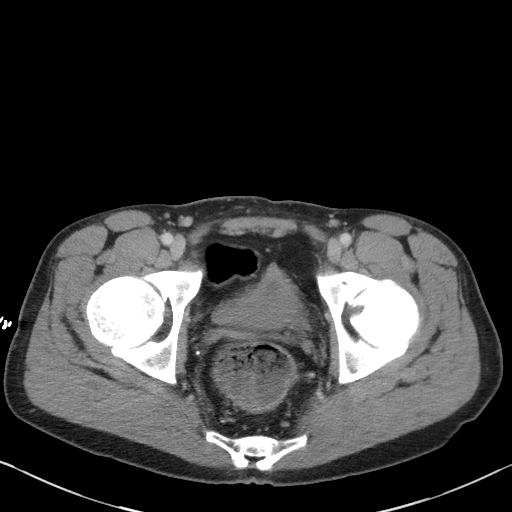
[im 25/88  soft-tissue]
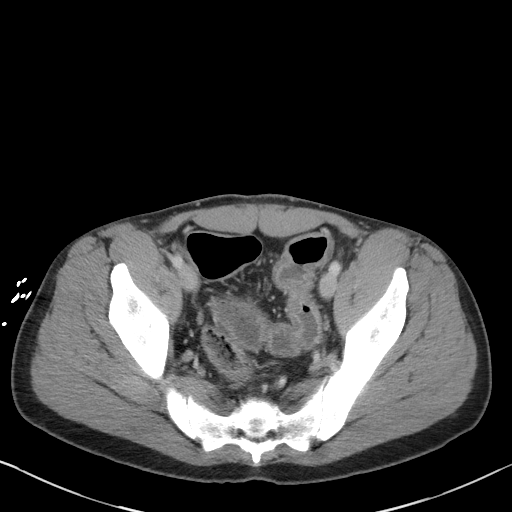
[im 32/88  soft-tissue]
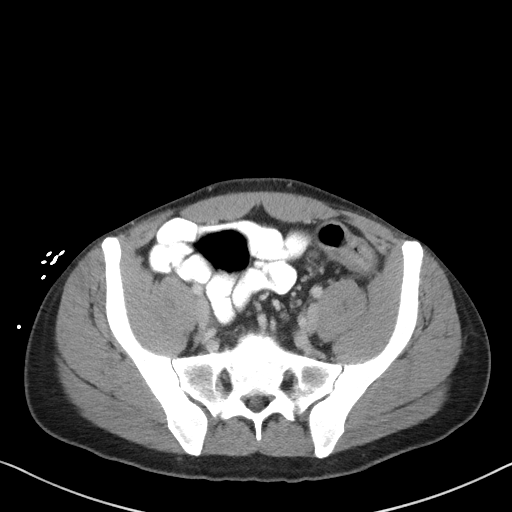
[im 39/88  soft-tissue]
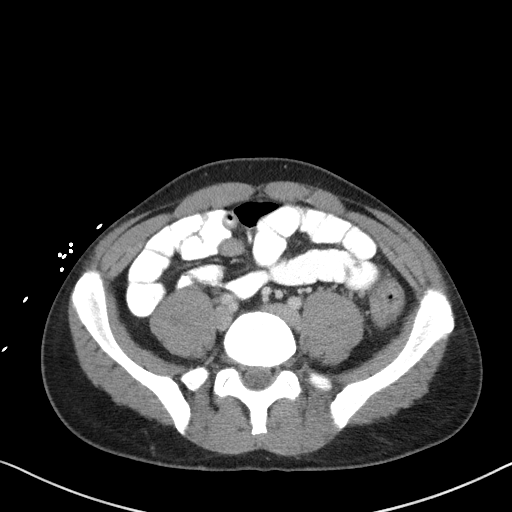
[im 46/88  soft-tissue]
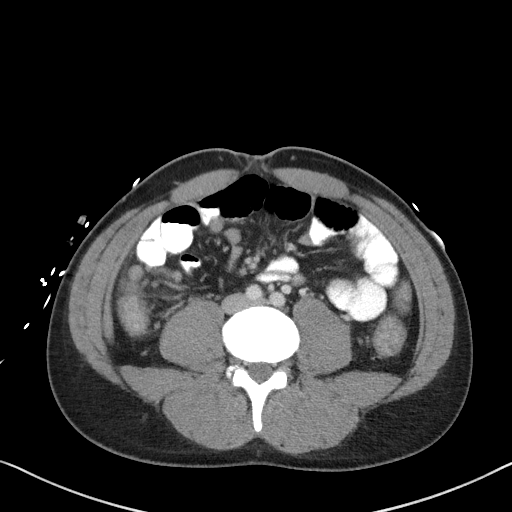
[im 49/88  soft-tissue]
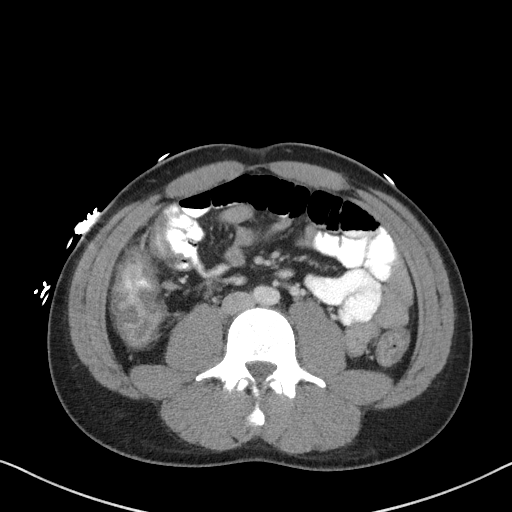
[im 56/88  soft-tissue]
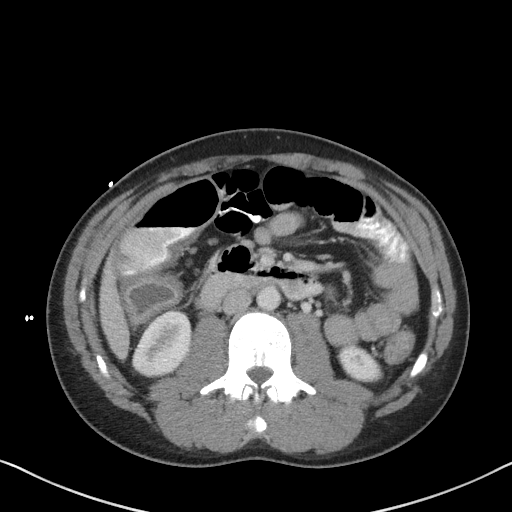
[im 56/88  bone]
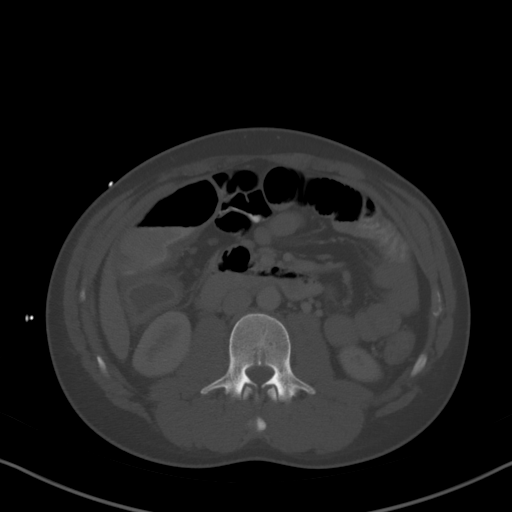
[im 63/88  soft-tissue]
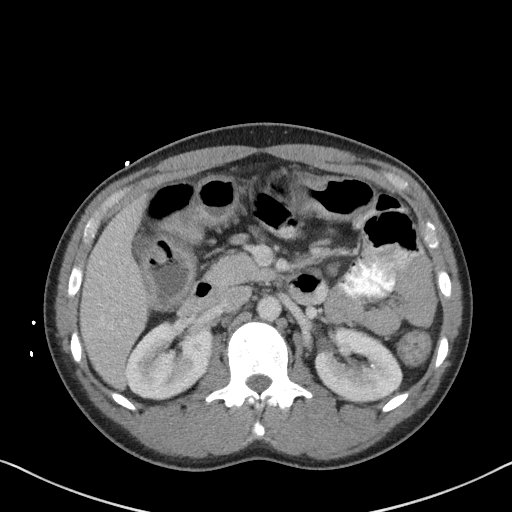
[im 70/88  soft-tissue]
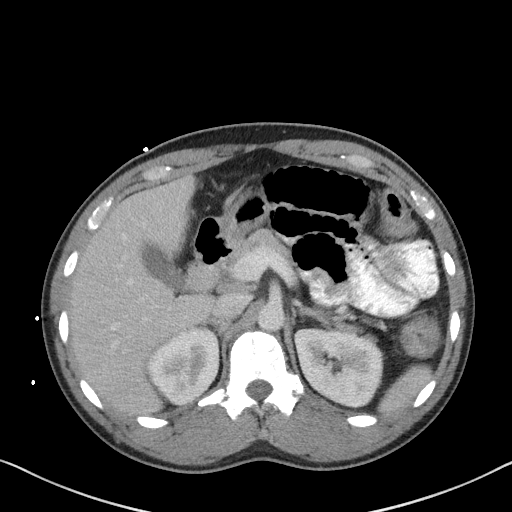
[im 77/88  soft-tissue]
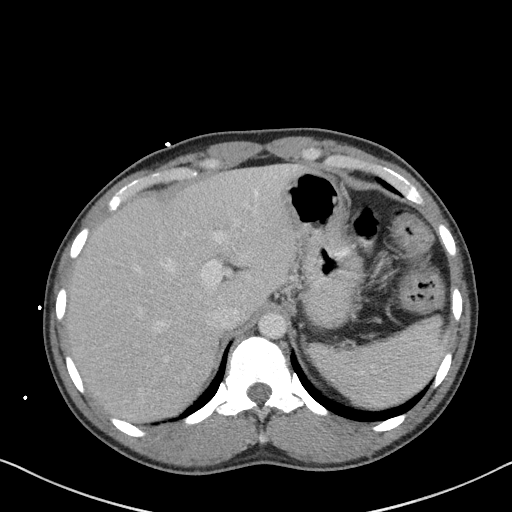
[im 84/88  soft-tissue]
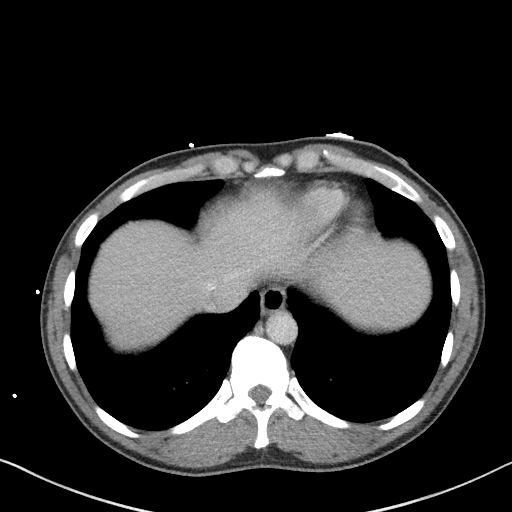

[Series 7: coronal st · coronal · 0.62mm/px · 3 of 85 slices shown]
[im 29/85  soft-tissue]
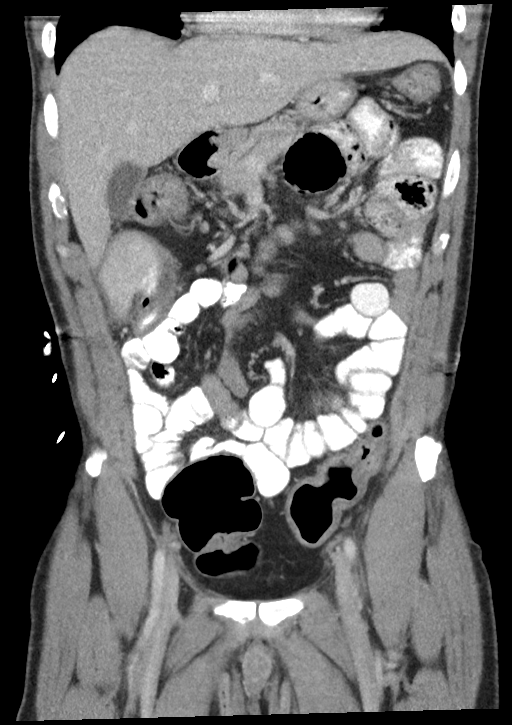
[im 38/85  soft-tissue]
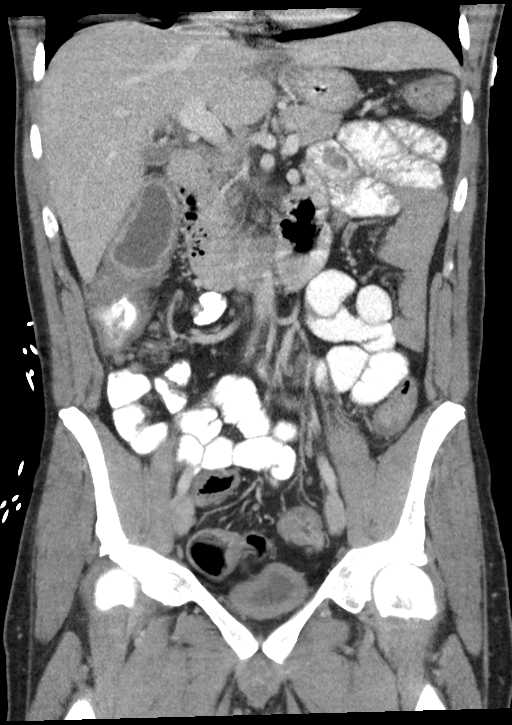
[im 47/85  soft-tissue]
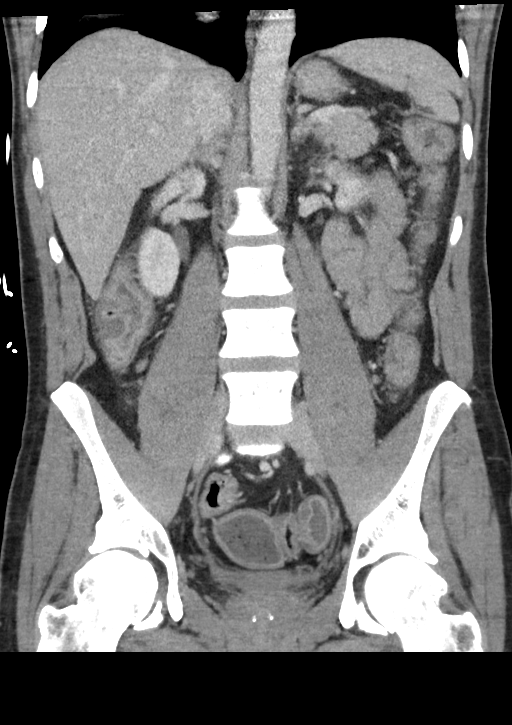

[16 of 46 positions shown; findings below may reference images not displayed]

FINDINGS: Lower Chest: No acute findings.

Hepatobiliary: No hepatic masses identified. Gallbladder is
unremarkable. No evidence of biliary ductal dilatation.

Pancreas:  No mass or inflammatory changes.

Spleen: Within normal limits in size and appearance.

Adrenals/Urinary Tract: No masses identified. A few tiny renal cysts
are again seen. 3 mm calculus is again seen in the midpole of the
left kidney. No evidence of ureteral calculi or hydronephrosis.

Stomach/Bowel: Mild-to-moderate colonic wall thickening is seen
involving the ascending and transverse colon, consistent with
colitis. No evidence of small bowel involvement or bowel
obstruction. No evidence of abscess or free fluid.

Vascular/Lymphatic: Mild right lower quadrant mesenteric and right
pericolonic lymphadenopathy is new since previous study, with
largest lymph node measuring 10 mm on image 44/4. This is likely
reactive in etiology. No other sites of lymphadenopathy identified.
No abdominal aortic aneurysm.

Reproductive:  No mass or other significant abnormality.

Other:  None.

Musculoskeletal:  No suspicious bone lesions identified.
IMPRESSION: Mild-to-moderate colitis predominantly involving the ascending and
transverse colon. No evidence of abscess or other complication.

Mild right lower quadrant and pericolonic mesenteric
lymphadenopathy, likely reactive in etiology.

Tiny nonobstructing left renal calculus.

## 2023-03-22 ENCOUNTER — Ambulatory Visit (INDEPENDENT_AMBULATORY_CARE_PROVIDER_SITE_OTHER): Payer: Medicaid Other | Admitting: Family Medicine

## 2023-03-22 ENCOUNTER — Encounter: Payer: Self-pay | Admitting: Family Medicine

## 2023-03-22 VITALS — BP 113/73 | HR 72 | Resp 16 | Ht 72.0 in | Wt 168.7 lb

## 2023-03-22 DIAGNOSIS — I483 Typical atrial flutter: Secondary | ICD-10-CM

## 2023-03-22 DIAGNOSIS — K50111 Crohn's disease of large intestine with rectal bleeding: Secondary | ICD-10-CM | POA: Diagnosis not present

## 2023-03-22 DIAGNOSIS — Z136 Encounter for screening for cardiovascular disorders: Secondary | ICD-10-CM

## 2023-03-22 DIAGNOSIS — I071 Rheumatic tricuspid insufficiency: Secondary | ICD-10-CM | POA: Diagnosis not present

## 2023-03-22 DIAGNOSIS — E559 Vitamin D deficiency, unspecified: Secondary | ICD-10-CM

## 2023-03-22 DIAGNOSIS — R0602 Shortness of breath: Secondary | ICD-10-CM

## 2023-03-22 MED ORDER — ADALIMUMAB 40 MG/0.4ML ~~LOC~~ AJKT
40.0000 mg | AUTO-INJECTOR | SUBCUTANEOUS | 0 refills | Status: DC
Start: 2023-03-22 — End: 2023-05-05

## 2023-03-22 MED ORDER — APIXABAN 5 MG PO TABS
5.0000 mg | ORAL_TABLET | Freq: Two times a day (BID) | ORAL | 1 refills | Status: DC
Start: 2023-03-22 — End: 2023-04-28

## 2023-03-22 NOTE — Patient Instructions (Addendum)
 Please review the attached list of medications and notify my office if there are any errors.   Start taking Eliquis  1 tablet twice a day to prevent blood clots from forming in your heart

## 2023-03-22 NOTE — Progress Notes (Signed)
 New patient   Patient: Marcus Gordon   DOB: [birth year]   37 y.o. Male  MRN: [id number] Visit Date: 03/22/2023  Today's healthcare provider: Jeralene Mom, MD   Chief Complaint  Patient presents with   Annual Exam   Subjective    Marcus Gordon is a 38 y.o. male who presents today establish care. He has not had insurance for several year and was last seen at [facility] in 2018. He has a long history of Crohn's disease on Humira  prescribed by rheumatology where he has a follow up scheduled on 04/06/2023. He is overdue for rheumatology follow up and is not able to get prescription refilled until his appointment, but has been out Humira  for a few weeks and have frequent mucousy stools mixed with small amount of blood.   He was briefly admitted to [facility] in September where he presented with shortness of breath, headache and chest pain and was found to be in atrial flutter. He had echo remarkable mainly for tricuspid regurgitation. CTA noted for mild global cardiomegaly with particular enlargement of the right atrium.He was prescribed Eliquis , furosemide  and potassium. He took medications for 1 month but has not had any follow up since then. He does have history smoking and moderate alcohol use but has stopped both since [facility] visit. He had started a job at [employer] just prior to that episode,but had to stop due to DOE limiting his physical activity. He has since got on Medicaid.    Past Medical History:  Diagnosis Date   Arthritis    hips and legs   Asthma    Crohn's disease (HCC)    External hemorrhoids    GERD (gastroesophageal reflux disease)    Past Surgical History:  Procedure Laterality Date   COLONOSCOPY     COLONOSCOPY WITH PROPOFOL  N/A 08/14/2015   Procedure: COLONOSCOPY WITH PROPOFOL ;  Surgeon: Marnee Sink, MD;  Location: [facility];  Service: Endoscopy;  Laterality: N/A;   POLYPECTOMY  08/14/2015   Procedure: POLYPECTOMY;  Surgeon: Marnee Sink, MD;  Location: [facility];  Service: Endoscopy;;   Social History   Socioeconomic History   Marital status: Single    Spouse name: Not on file   Number of children: Not on file   Years of education: Not on file   Highest education level: Not on file  Occupational History   Not on file  Tobacco Use   Smoking status: Every Day    Current packs/day: 1.00    Average packs/day: 1 pack/day for 13.0 years (13.0 ttl pk-yrs)    Types: Cigarettes   Smokeless tobacco: Never  Vaping Use   Vaping status: Never Used  Substance and Sexual Activity   Alcohol use: Yes    Comment: occ   Drug use: Yes    Types: Marijuana   Sexual activity: Not on file  Other Topics Concern   Not on file  Social History Narrative   Not on file   Social Drivers of Health   Financial Resource Strain: Not on file  Food Insecurity: Not on file  Transportation Needs: Not on file  Physical Activity: Not on file  Stress: Not on file  Social Connections: Not on file  Intimate Partner Violence: Not on file   Family Status  Relation Name Status   Mother  Alive   Father  Alive   Brother  Alive  No partnership data on file   Family History  Problem Relation Age  of Onset   Healthy Mother    Healthy Father    Healthy Brother    Allergies  Allergen Reactions   Aspirin     GI upset   Other     Other reaction(s): Other (See Comments) Anti-inflammatory-GI upset   Remicade [Infliximab]     Gastrointestinal agents   Dilaudid [Hydromorphone Hcl] Rash    Itching   Morphine And Codeine Rash    Patient Care Team: Lamon Pillow, MD as PCP - General (Family Medicine) Marnee Sink, MD as Consulting Physician (Gastroenterology)   Medications: Outpatient Medications Prior to Visit  Medication Sig   furosemide  (LASIX ) 20 MG tablet Take 1 tablet (20 mg total) by mouth daily.   nicotine  (NICODERM CQ  - DOSED IN MG/24 HOURS) 14 mg/24hr patch Place 1 patch (14 mg total) onto the skin daily.    potassium chloride  SA (KLOR-CON  M) 20 MEQ tablet Take 1 tablet (20 mEq total) by mouth daily.   SUMAtriptan  (IMITREX ) 50 MG tablet Take 1 tablet (50 mg total) by mouth daily as needed for migraine or headache. May repeat in 2 hours if headache persists or recurs.   [DISCONTINUED] adalimumab  (HUMIRA ) 40 MG/0.4ML pen Inject 0.4 mLs (40 mg total) into the skin every 14 (fourteen) days.   [DISCONTINUED] apixaban  (ELIQUIS ) 5 MG TABS tablet Take 1 tablet (5 mg total) by mouth 2 (two) times daily.   No facility-administered medications prior to visit.    Review of Systems  Constitutional:  Negative for appetite change, chills and fever.  Respiratory:  Positive for chest tightness and shortness of breath. Negative for wheezing.   Cardiovascular:  Positive for chest pain and palpitations.  Gastrointestinal:  Negative for abdominal pain, nausea and vomiting.      Objective    BP 113/73 (BP Location: Left Arm, Patient Position: Sitting, Cuff Size: Normal)   Pulse 72   Resp 16   Ht 6' (1.829 m)   Wt 168 lb 11.2 oz (76.5 kg)   BMI 22.88 kg/m    Physical Exam   General Appearance:    Well developed, well nourished male. Alert, cooperative, in no acute distress, appears stated age  Head:    Normocephalic, without obvious abnormality, atraumatic  Eyes:    PERRL, conjunctiva/corneas clear, EOM's intact, fundi    benign, both eyes       Ears:    Normal TM's and external ear canals, both ears  Nose:   Nares normal, septum midline, mucosa normal, no drainage   or sinus tenderness  Throat:   Lips, mucosa, and tongue normal; teeth and gums normal  Neck:   Supple, symmetrical, trachea midline, no adenopathy;       thyroid :  No enlargement/tenderness/nodules; no carotid   bruit or JVD  Back:     Symmetric, no curvature, ROM normal, no CVA tenderness  Lungs:     Clear to auscultation bilaterally, respirations unlabored  Chest wall:    No tenderness or deformity  Heart:    Normal heart rate. Normal  rhythm. No murmurs, rubs, or gallops.  S1 and S2 normal  Abdomen:     Soft, non-tender, bowel sounds active all four quadrants,    no masses, no organomegaly  Genitalia:    deferred  Rectal:    deferred  Extremities:   All extremities are intact. No cyanosis or edema  Pulses:   2+ and symmetric all extremities  Skin:   Skin color, texture, turgor normal, no rashes or lesions  Lymph  nodes:   Cervical, supraclavicular, and axillary nodes normal  Neurologic:   CNII-XII intact. Normal strength, sensation and reflexes      throughout    EKG: Typical a-flutter with 4:1 block   Last depression screening scores    03/22/2023   10:35 AM  PHQ 2/9 Scores  PHQ - 2 Score 2  PHQ- 9 Score 5   Last fall risk screening    03/22/2023   10:35 AM  Fall Risk   Falls in the past year? 0  Number falls in past yr: 0  Injury with Fall? 0  Risk for fall due to : No Fall Risks   Last Audit-C alcohol use screening     No data to display         A score of 3 or more in women, and 4 or more in men indicates increased risk for alcohol abuse, EXCEPT if all of the points are from question 1     Assessment & Plan    1. Typical atrial flutter (HCC) (Primary) Originally diagnosed September 2024 but only recently been able to get on Medicaid so has not yet followed up with cardiology and is off of DOAC - Comprehensive metabolic panel - Lipid panel  - apixaban  (ELIQUIS ) 5 MG TABS tablet; Take 1 tablet (5 mg total) by mouth 2 (two) times daily.  Dispense: 60 tablet; Refill: 1 Discussed risk/benefit of blood thinners. Consider underlying Crohn's disease with frequent episodes of blood in stool will start with just 1 daily. Will hopefully get back on Humira  to control bowels in which case he should go up to full BID dose.   2. Crohn's disease of colon with rectal bleeding (HCC) Is scheduled to follow up with rheumatologist at end of month. Will try to get prescription filled to get back under control  until then.  - adalimumab  (HUMIRA ) 40 MG/0.4ML pen; Inject 0.4 mLs (40 mg total) into the skin every 14 (fourteen) days.  Dispense: 2 each; Refill: 0  3. Shortness of breath Secondary to a-flutter.   4. Moderate tricuspid regurgitation   5. Vitamin D  deficiency Lab Results  Component Value Date   VD25OH 9.9 (L) 05/28/2020   - VITAMIN D  25 Hydroxy (Vit-D Deficiency, Fractures)  6. Encounter for special screening examination for cardiovascular disorder  - Comprehensive metabolic panel - Lipid panel        Jeralene Mom, MD  [facility] [phone] (phone) [phone] (fax)  [facility]

## 2023-03-23 ENCOUNTER — Encounter: Payer: Self-pay | Admitting: Family Medicine

## 2023-03-23 ENCOUNTER — Telehealth: Payer: Self-pay | Admitting: Family Medicine

## 2023-03-23 DIAGNOSIS — E559 Vitamin D deficiency, unspecified: Secondary | ICD-10-CM | POA: Insufficient documentation

## 2023-03-23 LAB — COMPREHENSIVE METABOLIC PANEL
ALT: 32 [IU]/L (ref 0–44)
AST: 28 [IU]/L (ref 0–40)
Albumin: 4.5 g/dL (ref 4.1–5.1)
Alkaline Phosphatase: 142 [IU]/L — ABNORMAL HIGH (ref 44–121)
BUN/Creatinine Ratio: 13 (ref 9–20)
BUN: 16 mg/dL (ref 6–20)
Bilirubin Total: 0.6 mg/dL (ref 0.0–1.2)
CO2: 20 mmol/L (ref 20–29)
Calcium: 9.7 mg/dL (ref 8.7–10.2)
Chloride: 107 mmol/L — ABNORMAL HIGH (ref 96–106)
Creatinine, Ser: 1.24 mg/dL (ref 0.76–1.27)
Globulin, Total: 3.1 g/dL (ref 1.5–4.5)
Glucose: 85 mg/dL (ref 70–99)
Potassium: 4.6 mmol/L (ref 3.5–5.2)
Sodium: 145 mmol/L — ABNORMAL HIGH (ref 134–144)
Total Protein: 7.6 g/dL (ref 6.0–8.5)
eGFR: 77 mL/min/{1.73_m2} (ref >59)

## 2023-03-23 LAB — LIPID PANEL
Chol/HDL Ratio: 3.4 {ratio} (ref 0.0–5.0)
Cholesterol, Total: 192 mg/dL (ref 100–199)
HDL: 56 mg/dL (ref >39)
LDL Chol Calc (NIH): 123 mg/dL — ABNORMAL HIGH (ref 0–99)
Triglycerides: 68 mg/dL (ref 0–149)
VLDL Cholesterol Cal: 13 mg/dL (ref 5–40)

## 2023-03-23 LAB — VITAMIN D 25 HYDROXY (VIT D DEFICIENCY, FRACTURES): Vit D, 25-Hydroxy: 12 ng/mL — ABNORMAL LOW (ref 30.0–100.0)

## 2023-03-23 NOTE — Telephone Encounter (Signed)
Covermymeds is requesting prior authorization Key: [id number] Jumira (2 Pen) 40MG /0.8ml auto injector Kit

## 2023-03-28 NOTE — Telephone Encounter (Signed)
Pt following up on request for adalimumab (HUMIRA) 40 MG/0.4ML pen that needs a prior auth.  He said he has not had a shot in 2 months..  Please advise

## 2023-03-29 ENCOUNTER — Telehealth: Payer: Self-pay | Admitting: Gastroenterology

## 2023-03-29 NOTE — Telephone Encounter (Signed)
Received a fax from covermymeds for Adalimumab-aaty  Key:  [id number]

## 2023-03-29 NOTE — Telephone Encounter (Signed)
I called the patient back to schedule an office visit and added him to the cancellation list.

## 2023-03-29 NOTE — Telephone Encounter (Signed)
The patient called in requesting to speak to Dr. Servando Snare about his Crohn's. He needs some more medication he has been out for about 3 to 4 months. I called the patient back to let him know we received his message, and the patient is having blood in his stools, lost a lot of weight, decrease appetite. He is now dealing with a heart problem as well. He was not able to get his shots because he didn't have insurance.

## 2023-03-29 NOTE — Telephone Encounter (Signed)
We do not show active insurance coverage. When I entered the Key, it pulled [facility], but eligibility could not be verified. Will need to confirm insurance with patient.

## 2023-03-29 NOTE — Telephone Encounter (Signed)
He is schedule with Dr. Allegra Lai on 05/17/23 at 3:00 PM.

## 2023-03-29 NOTE — Telephone Encounter (Signed)
PA submitted in Cover My Meds for Adalimumab-aaty 40 mg/ 0.4 mL auto injector   Debarah Crape (Key: [id number]) Adalimumab-aaty (1 Pen) 40MG /0.4ML auto-injector kit  Form [place] [employer] Managed Medicaid Electronic Prior Authorization Request Form  Determination Wait for Determination Please wait for [place] [employer] MCD 2017 to return a determination.

## 2023-03-29 NOTE — Telephone Encounter (Signed)
When I open patient through chart review, it shows No medical coverage. When I open him through this encounter, it shows [place] Medicaid   Primary Visit Coverage  Payer Plan Sponsor Code Group Number Group Name  [facility] MEDICAID PREPAID HEALTH PLAN [place] MEDICAID [facility] COMPLETE HEALTH      Primary Visit Coverage Subscriber  ID Name [facility] Address  [id number] [place], [place] [id number]     [place], [place] [address]    Will attempt to submit PA with this information.

## 2023-03-29 NOTE — Telephone Encounter (Signed)
Per Dr. Allegra Lai he can have next available appointment and then we can put him on the wait list and if anything comes up sooner we will call him

## 2023-03-31 NOTE — Telephone Encounter (Signed)
Received another fax from covermymeds for Humira (2 pens) 40 mg./0.71ml auto injector kit   Key:  [id number]

## 2023-04-03 ENCOUNTER — Telehealth: Payer: Self-pay

## 2023-04-03 NOTE — Telephone Encounter (Signed)
Recieved a fax from covermymeds for Adalimumab-aaty (1 Pen). Your Patient is unable to start prescription   ZOX:[id number]

## 2023-04-03 NOTE — Telephone Encounter (Signed)
I have a opening on 04/04/2023 in our [place] location. Called patient and left a message for call back to find out if he wanted to be seen

## 2023-04-05 ENCOUNTER — Telehealth: Payer: Self-pay

## 2023-04-05 NOTE — Telephone Encounter (Signed)
Copied from CRM [id number]. Topic: General - Other >> Apr 04, 2023 12:53 PM Everette C wrote: Reason for CRM: Sharlet Salina with covermymeds has called for an update on the appeal of recently denied prior authorization for adalimumab (HUMIRA) 40 MG/0.4ML pen [[id number]]  An appeal form has been submitted to the practice via fax   Please call [phone] when possible to further discuss the appeal and confirm receipt of the form   Please contact when available

## 2023-04-27 ENCOUNTER — Encounter: Payer: Self-pay | Admitting: Gastroenterology

## 2023-04-27 ENCOUNTER — Other Ambulatory Visit: Payer: Self-pay

## 2023-04-27 ENCOUNTER — Telehealth: Payer: Self-pay

## 2023-04-27 ENCOUNTER — Ambulatory Visit (INDEPENDENT_AMBULATORY_CARE_PROVIDER_SITE_OTHER): Payer: Medicaid Other | Admitting: Gastroenterology

## 2023-04-27 VITALS — BP 121/80 | HR 73 | Temp 97.7°F | Ht 72.0 in | Wt 174.2 lb

## 2023-04-27 DIAGNOSIS — K5080 Crohn's disease of both small and large intestine without complications: Secondary | ICD-10-CM | POA: Insufficient documentation

## 2023-04-27 DIAGNOSIS — K50818 Crohn's disease of both small and large intestine with other complication: Secondary | ICD-10-CM | POA: Diagnosis not present

## 2023-04-27 DIAGNOSIS — K50811 Crohn's disease of both small and large intestine with rectal bleeding: Secondary | ICD-10-CM

## 2023-04-27 MED ORDER — NA SULFATE-K SULFATE-MG SULF 17.5-3.13-1.6 GM/177ML PO SOLN: 354.0000 mL | Freq: Once | ORAL | 0 refills | Status: AC

## 2023-04-27 NOTE — Telephone Encounter (Signed)
   [facility] Pre-operative Risk Assessment    Request for surgical clearance:  What type of surgery is being performed? Colonoscopy    When is this surgery scheduled? 05/05/2023   Are there any medications that need to be held prior to surgery and how long?Xarelto    Practice name and name of physician performing surgery? [facility] Dr Allegra Lai    What is your office phone and fax number? [phone]   Anesthesia type (None, local, MAC, general) ? General    Denman George 04/27/2023, 2:14 PM  _________________________________________________________________   (provider comments below)

## 2023-04-27 NOTE — Telephone Encounter (Signed)
Pharmacy please advise on holding Eliquis prior to Colonoscopy scheduled for 05/05/2023. Thank you.    Samara Deist

## 2023-04-27 NOTE — Progress Notes (Signed)
Arlyss Repress, MD [address]  [address]  [place], [place] [address]  Main: [phone]  Fax: [phone]    Gastroenterology Consultation  Referring Provider:     [name], MD Primary Care Physician:  [name], MD Primary Gastroenterologist:  Dr. Lannette Donath Reason for Consultation:     Crohn's disease        HPI:   Fallou Hulbert is a 38 y.o. male referred by Dr. [name], MD  for consultation & management of Crohn's disease  Patient had a recent flareup of his Crohn's disease, went to ER on 3/22 secondary to abdominal pain, rectal bleeding, diarrhea as well as 18 pound weight loss associated with worsening fatigue and weakness, he is unable to eat or drink, also with reflux.  He is found to have significant leukocytosis, WBC 18.9, thrombocytosis, hemoglobin 13.6, CMP normal.  He underwent CT abdomen and pelvis which revealed thickening of the right colon.  He is discharged home on prednisone 40 mg with taper.  Patient was last seen by GI in 2017, at that time he underwent colonoscopy which revealed mild disease only.  He was maintained on biweekly Humira until 8 months ago, lost his disability and is no longer able to afford  He has been on prednisone 40 mg for last 2 days, reports that his appetite is picking up slowly, he had 2 bowel movements today, rectal bleeding is improving, denies abdominal pain.  He denies any joint pains.  He denies fever, chills, nausea or vomiting  Follow-up visit 04/27/2023 Mr. Wirt was lost to follow-up for 3 years and finally came to see me today for his Crohn's disease.  He was diagnosed with no onset of atrial flutter in September 2024 when he presented to hospital with chest pain and shortness of breath.  He was discharged on apixaban with the plan to follow-up with cardiology.  Patient has appointment with Dr. Mariah Milling tomorrow.  He reports flareup of Crohn's symptoms in January 2025 including bloody diarrhea,  weight loss, loss of appetite, along with inflammatory arthritis.  He was seen by Dr. Angie Fava, [facility] clinic rheumatologist who started him on prednisone.  He was also discussed to switch from Humira to Cimzia.  Since initiating prednisone, he reports that rectal bleeding has subsided, appetite improved, regaining weight, however having 3-4 bowel movements daily.  He is requesting reinitiation of Humira or some other medication.  He did not undergo colonoscopy as recommended by me in the past to assess severity of his Crohn's disease.  He also states that he is smoking 1 to 2 cigarettes daily  With regards to Crohn's disease, he stopped taking Humira in August 2024.  Apparently, patient was taking medications that belonged to someone else.  He was also receiving it previously through [employer] patient assistance program and he was rationing his injections.  He does vape, smokes marijuana.  He also admits to smoking tobacco  Patient denies family history of IBD, GI malignancy  Crohn's disease classification:  Age: < 17 Location: ileocolonic Behavior: non stricturing, non penetrating  Perianal:  no  IBD diagnosis: At the age of 37  Disease course:Patient is diagnosed with Crohn's disease at age 45, initial presentation of rectal bleeding, diarrhea, abdominal pain and weight loss, he was initially on prednisone, then Remicade, patient reports that he developed allergic reaction to it (hives all over his body), therefore switched to Humira every other week.  Patient reports that he has been  on Humira until 8 months ago and stopped as he lost his insurance.   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 Resected and retrieved. - A few ulcers in the terminal ileum. Biopsied. - Mucosal  ulceration. Biopsied. - Anal fissure found on digital rectal exam. - Non-bleeding internal hemorrhoids.    Colonoscopy 08/02/2012  The examined portion of the ileum was normal. Biopsied.-Inflammationwas found in the colon secondary to Crohn's Disease with colonic involvement . Non-bleeding internal hemorrhoids. Multiple biopsies were obtained.  Diagnosis:  Part A: COLD BIOPSY TERMINAL ILEUM:  - SMALL BOWEL MUCOSA WITH PRESERVED VILLOUS ARCHITECTURE.  - NEGATIVE FOR INTRAEPITHELIAL LYMPHOCYTOSIS, DYSPLASIA AND  MALIGNANCY.  .  Part B: COLD BIOPSY RIGHT COLON:  - FOCAL ACTIVE COLITIS, SEE COMMENT.  - NEGATIVE FOR GRANULOMATA, INFECTIOUS PATHOGENS AND MALIGNANCY.  .  Part C: COLD BIOPSY LEFT COLON:  - NO SIGNIFICANT PATHOLOGIC CHANGES.  - NEGATIVE FOR INTRAEPITHELIAL LYMPHOCYTOSIS, DYSPLASIA AND  MALIGNANCY.    Upper Endoscopy none  VCE none  IBD medications:  Steroids: Responsive to prednisone 5-ASA: None  Immunomodulators: AZA, methotrexate nave TPMT status unknown Biologics:  Anti TNFs: Developed allergic reaction to Remicade.  Previously maintained on Humira biweekly in clinical remission Anti Integrins: Ustekinumab: Tofactinib: Clinical trial:   NSAIDs: None  Antiplts/Anticoagulants/Anti thrombotics: None   Past Medical History:  Diagnosis Date   Arthritis    hips and legs   Asthma    Crohn's disease (HCC)    External hemorrhoids    GERD (gastroesophageal reflux disease)     Past Surgical History:  Procedure Laterality Date   COLONOSCOPY     COLONOSCOPY WITH PROPOFOL N/A 08/14/2015   Procedure: COLONOSCOPY WITH PROPOFOL;  Surgeon: Midge Minium, MD;  Location[facility];  Service: Endoscopy;  Laterality: N/A;   POLYPECTOMY  08/14/2015   Procedure: POLYPECTOMY;  Surgeon: Midge Minium, MD;  Location: [facility];  Service: Endoscopy;;   Current Outpatient Medications:    apixaban (ELIQUIS) 5 MG TABS tablet, Take 1 tablet (5 mg total) by  mouth 2 (two) times daily., Disp: 60 tablet, Rfl: 1   Na Sulfate-K Sulfate-Mg Sulfate concentrate (SUPREP) 17.5-3.13-1.6 GM/177ML SOLN, Take 1 kit (354 mLs total) by mouth once for 1 dose., Disp: 354 mL, Rfl: 0   SUMAtriptan (IMITREX) 50 MG tablet, Take 1 tablet (50 mg total) by mouth daily as needed for migraine or headache. May repeat in 2 hours if headache persists or recurs., Disp: 10 tablet, Rfl: 0   adalimumab (HUMIRA) 40 MG/0.4ML pen, Inject 0.4 mLs (40 mg total) into the skin every 14 (fourteen) days. (Patient not taking: Reported on 04/27/2023), Disp: 2 each, Rfl: 0    Family History  Problem Relation Age of Onset   Healthy Mother    Healthy Father    Healthy Brother      Social History   Tobacco Use   Smoking status: Some Days    Current packs/day: 1.00    Average packs/day: 1 pack/day for 13.0 years (13.0 ttl pk-yrs)    Types: Cigarettes   Smokeless tobacco: Never  Vaping Use   Vaping status: Never Used  Substance Use Topics   Alcohol use: Yes    Comment: occ   Drug use: Yes    Types: Marijuana    Allergies as of 04/27/2023 -  Review Complete 04/27/2023  Allergen Reaction Noted   Aspirin  02/06/2015   Other  02/06/2015   Dilaudid [hydromorphone hcl] Rash 02/06/2015   Morphine and codeine Rash 02/06/2015   Remicade [infliximab] Rash 02/06/2015    Review of Systems:    All systems reviewed and negative except where noted in HPI.   Physical Exam:  BP 121/80 (BP Location: Left Arm, Patient Position: Sitting, Cuff Size: Normal)   Pulse 73   Temp 97.7 F (36.5 C) (Oral)   Ht 6' (1.829 m)   Wt 174 lb 4 oz (79 kg)   BMI 23.63 kg/m  No LMP for male patient.  General:   Alert, thin built, moderately nourished, pleasant and cooperative in NAD Head:  Normocephalic and atraumatic. Eyes:  Sclera clear, no icterus.   Conjunctiva pink. Ears:  Normal auditory acuity. Nose:  No deformity, discharge, or lesions. Mouth:  No deformity or lesions,oropharynx pink &  moist. Neck:  Supple; no masses or thyromegaly. Lungs:  Respirations even and unlabored.  Clear throughout to auscultation.   No wheezes, crackles, or rhonchi. No acute distress. Heart:  Regular rate and rhythm; no murmurs, clicks, rubs, or gallops. Abdomen:  Normal bowel sounds. Soft, non-tender and mildly distended tympanic to percussion, without masses, hepatosplenomegaly or hernias noted.  No guarding or rebound tenderness.   Rectal: Not performed Msk:  Symmetrical without gross deformities. Good, equal movement & strength bilaterally. Pulses:  Normal pulses noted. Extremities:  No clubbing or edema.  No cyanosis. Neurologic:  Alert and oriented x3;  grossly normal neurologically. Skin:  Intact without significant lesions or rashes. No jaundice. Lymph Nodes:  No significant cervical adenopathy. Psych:  Alert and cooperative. Normal mood and affect.  Imaging Studies: CT abdomen and pelvis 05/26/2020 IMPRESSION: Mild-to-moderate colitis predominantly involving the ascending and transverse colon. No evidence of abscess or other complication.   Mild right lower quadrant and pericolonic mesenteric lymphadenopathy, likely reactive in etiology.   Tiny nonobstructing left renal calculus.  Assessment and Plan:   Zedekiah Hinderman is a 38 y.o. African-American male with history of ileocolonic Crohn's disease diagnosed at age 32, nonstricturing nonpenetrating phenotype, sacroiliitis with spondyloarthropathy, allergic reaction to Remicade, previously maintained on biweekly Humira monotherapy until 2021, then restarted by taking medication prescribed to somebody else and rationing it once a month through [employer] patient assistance program.  Last Humira dose was in August 2024.  Has Crohn's exacerbation and inflammatory arthritis since discontinuation of Humira, progressively worsening symptoms s/p prednisone started in 03/2023 with significant improvement in his symptoms  Flareup of Crohn's  disease Responding to prednisone Advised to stay on prednisone 20 mg daily Recommend colonoscopy with TI evaluation to assess severity of the disease Patient states that Humira has worked best for him, therefore do not recommend to switch medication at this time After colonoscopy, will apply for Humira either through insurance or patient assistance program Discussed with patient regarding medication adherence, compliance to follow-up visits for better management of his Crohn's disease  A-flutter, new onset diagnosed in 10/2022 Rate controlled, currently on Eliquis Has appointment with Dr. Mariah Milling tomorrow Will obtain cardiac clearance and clearance for interruption of Eliquis for colonoscopy   Follow up in 4 to 6 weeks   Arlyss Repress, MD

## 2023-04-28 ENCOUNTER — Ambulatory Visit: Payer: Medicaid Other | Attending: Cardiovascular Disease | Admitting: Cardiovascular Disease

## 2023-04-28 ENCOUNTER — Encounter: Payer: Self-pay | Admitting: Gastroenterology

## 2023-04-28 ENCOUNTER — Encounter: Payer: Self-pay | Admitting: Cardiovascular Disease

## 2023-04-28 VITALS — BP 108/60 | HR 83

## 2023-04-28 DIAGNOSIS — K50111 Crohn's disease of large intestine with rectal bleeding: Secondary | ICD-10-CM | POA: Diagnosis not present

## 2023-04-28 DIAGNOSIS — I4892 Unspecified atrial flutter: Secondary | ICD-10-CM

## 2023-04-28 DIAGNOSIS — F17200 Nicotine dependence, unspecified, uncomplicated: Secondary | ICD-10-CM

## 2023-04-28 MED ORDER — PROPRANOLOL HCL 20 MG PO TABS
20.0000 mg | ORAL_TABLET | Freq: Three times a day (TID) | ORAL | 3 refills | Status: DC | PRN
Start: 2023-04-28 — End: 2023-07-05

## 2023-04-28 NOTE — Telephone Encounter (Signed)
Patient with diagnosis of atrial fibrillation on Eliquis for anticoagulation.    What type of surgery is being performed? Colonoscopy     When is this surgery scheduled? 05/05/2023    CHA2DS2-VASc Score = 0   This indicates a 0.2% annual risk of stroke. The patient's score is based upon: CHF History: 0 HTN History: 0 Diabetes History: 0 Stroke History: 0 Vascular Disease History: 0 Age Score: 0 Gender Score: 0       CrCl 91 Platelet count 308  Per office protocol, patient can hold Eliquis for 2 days prior to procedure.   Patient will not need bridging with Lovenox (enoxaparin) around procedure.  **This guidance is not considered finalized until pre-operative APP has relayed final recommendations.**

## 2023-04-28 NOTE — Anesthesia Preprocedure Evaluation (Addendum)
 Anesthesia Evaluation  Patient identified by MRN, date of birth, ID band Patient awake    Reviewed: Allergy & Precautions, H&P , NPO status , Patient's Chart, lab work & pertinent test results  Airway Mallampati: I  TM Distance: >3 FB Neck ROM: Full    Dental no notable dental hx.    Pulmonary neg pulmonary ROS, shortness of breath, asthma , Current Smoker and Patient abstained from smoking. 13 pack year smoking history  Also Marijuana    Pulmonary exam normal breath sounds clear to auscultation       Cardiovascular negative cardio ROS Normal cardiovascular exam Rhythm:Irregular Rate:Normal  11-17-22 1. Left ventricular ejection fraction, by estimation, is 55 to 60%. The  left ventricle has normal function. The left ventricle has no regional  wall motion abnormalities. Left ventricular diastolic parameters are  indeterminate.   2. Right ventricular systolic function is mildly reduced. The right  ventricular size is moderately enlarged. There is normal pulmonary artery  systolic pressure. The estimated right ventricular systolic pressure is  25.2 mmHg.   3. Right atrial size was mildly dilated.   4. The mitral valve is normal in structure. Mild mitral valve  regurgitation. No evidence of mitral stenosis.   5. Tricuspid valve regurgitation is moderate.   6. The aortic valve is tricuspid. Aortic valve regurgitation is not  visualized. No aortic stenosis is present.   7. The inferior vena cava is normal in size with greater than 50%  respiratory variability, suggesting right atrial pressure of 3 mmHg.   03-22-23 Dr. Gavin Potters office note:  1. Typical atrial flutter (HCC) (Primary) Originally diagnosed September 2024 but only recently been able to get on Medicaid so has not yet followed up with cardiology and is off of DOAC - Comprehensive metabolic panel - Lipid panel   - apixaban (ELIQUIS) 5 MG TABS tablet; Take 1  tablet (5 mg total) by mouth 2 (two) times daily.  Dispense: 60 tablet; Refill: 1 Discussed risk/benefit of blood thinners. Consider underlying Crohn's disease with frequent episodes of blood in stool will start with just 1 daily. Will hopefully get back on Humira to control bowels in which case he should go up to full BID dose.    2. Crohn's disease of colon with rectal bleeding (HCC) Is scheduled to follow up with rheumatologist at end of month. Will try to get prescription filled to get back under control until then.  - adalimumab (HUMIRA) 40 MG/0.4ML pen; Inject 0.4 mLs (40 mg total) into the skin every 14 (fourteen) days.  Dispense: 2 each; Refill: 0   3. Shortness of breath Secondary to a-flutter.    4. Moderate tricuspid regurgitation   Dr. Mariah Milling notes: 04-28-23 Medication Instructions:  Propranolol 20 mg up to three times a day as needed for palpitations   Hold the eliquis 2 days before colonoscopy Restart eliquis when cleared by Stomach doctor   After 30 days we can schedule cardioversion   05-05-23 in atrial flutter on arrival, as his baseline     Neuro/Psych  Headaches negative neurological ROS  negative psych ROS   GI/Hepatic negative GI ROS, Neg liver ROS,GERD  ,,  Endo/Other  negative endocrine ROS    Renal/GU negative Renal ROS  negative genitourinary   Musculoskeletal negative musculoskeletal ROS (+) Arthritis ,    Abdominal   Peds negative pediatric ROS (+)  Hematology negative hematology ROS (+)   Anesthesia Other Findings Crohn's disease (HCC)  GERD (gastroesophageal reflux disease) External hemorrhoids  Asthma  Arthritis  Fluttering heart Migraine headache  Mild mitral regurgitation by prior echocardiogram Moderate tricuspid regurgitation by prior echocardiogram  Atrial flutter (HCC) Tobacco abuse  Marijuana use Alcohol   13 pack year smoking history   Reproductive/Obstetrics negative OB ROS                              Anesthesia Physical Anesthesia Plan  ASA: 3  Anesthesia Plan: General   Post-op Pain Management:    Induction: Intravenous  PONV Risk Score and Plan:   Airway Management Planned:   Additional Equipment:   Intra-op Plan:   Post-operative Plan: Extubation in OR  Informed Consent: I have reviewed the patients History and Physical, chart, labs and discussed the procedure including the risks, benefits and alternatives for the proposed anesthesia with the patient or authorized representative who has indicated his/her understanding and acceptance.     Dental advisory given  Plan Discussed with: CRNA  Anesthesia Plan Comments:         Anesthesia Quick Evaluation

## 2023-04-28 NOTE — Patient Instructions (Addendum)
Medication Instructions:  Propranolol 20 mg up to three times a day as needed for palpitations  Hold the eliquis 2 days before colonoscopy Restart eliquis when cleared by Stomach doctor  After 30 days we can schedule cardioversion  If you need a refill on your cardiac medications before your next appointment, please call your pharmacy.   Lab work: No new labs needed  Testing/Procedures: No new testing needed  Follow-Up: At [facility], you and your health needs are our priority.  As part of our continuing mission to provide you with exceptional heart care, we have created designated Provider Care Teams.  These Care Teams include your primary Cardiologist (physician) and Advanced Practice Providers (APPs -  Physician Assistants and Nurse Practitioners) who all work together to provide you with the care you need, when you need it.  You will need a follow up appointment in 1 month  Providers on your designated Care Team:   Nicolasa Ducking, NP Eula Listen, PA-C Cadence Fransico Michael, [place]  COVID-19 Vaccine Information can be found at: [email or web address] For questions related to vaccine distribution or appointments, please email [email or web address] or call [phone].

## 2023-04-28 NOTE — Progress Notes (Signed)
Cardiology Office Note  Date:  04/28/2023   ID:  Yousuf, Ager [birth year], MRN [id number]  PCP:  [name], MD   Chief Complaint  Patient presents with   New Patient (Initial Visit)    Ref by Dr. Sherrie Mustache for A-Flutter. Patient c/o blood in stools, shortness of breath and racing irregular heart beats.Patient is scheduled for a colonoscopy on 05/05/2023. Needs clarification on the Eliquis.     HPI:  [name] is a 38 year old gentleman with history of  Crohn's disease,  migraines,  smoker since age 68,  GERD  presenting with to the hospital November 17, 2022 with migraine and shortness of breath  Found to have atrial flutter on EKG Who presents to establish care in the cardiology clinic for his atrial flutter  In September 2024, he was in rate controlled flutter, timing of onset felt to be several months earlier given waxing waning symptoms of shortness of breath and palpitations Echo with normal LV function, moderate TR, dilated RV and RA Was started on Eliquis 5 twice daily Coupon provided, he had no insurance It was recommended he [facility] Plan was for cardioversion after 1 month on anticoagulation  Scheduled for colonoscopy May 05, 2023  Seen by GI yesterday April 27, 2023 Flareup of Crohn's symptoms January 2025  bloody diarrhea, weight loss, loss of appetite, along with inflammatory arthritis.  He was seen by Dr. Angie Fava, [facility] rheumatologist who started him on prednisone.    In follow-up today presents with family appreciates shortness of breath and palpitations Scheduled for colonoscopy February 28, needs to come off Eliquis 2 days prior to procedure Denies significant leg swelling, no PND orthopnea  EKG personally reviewed by myself on todays visit EKG Interpretation Date/Time:  Friday April 28 2023 10:20:42 EST Ventricular Rate:  83 PR Interval:    QRS Duration:  94 QT  Interval:  354 QTC Calculation: 415 R Axis:   23  Text Interpretation: Atrial flutter with variable A-V block Incomplete right bundle branch block When compared with ECG of 17-Nov-2022 02:20, No significant change was found Confirmed by [name] [id number]) on 04/28/2023 10:24:50 AM    PMH:   has a past medical history of Arthritis, Asthma, Crohn's disease (HCC), External hemorrhoids, Fluttering heart, GERD (gastroesophageal reflux disease), and Migraine headache.  PSH:    Past Surgical History:  Procedure Laterality Date   COLONOSCOPY     COLONOSCOPY WITH PROPOFOL N/A 08/14/2015   Procedure: COLONOSCOPY WITH PROPOFOL;  Surgeon: Midge Minium, MD;  Location: [facility];  Service: Endoscopy;  Laterality: N/A;   POLYPECTOMY  08/14/2015   Procedure: POLYPECTOMY;  Surgeon: Midge Minium, MD;  Location: [facility];  Service: Endoscopy;;    Current Outpatient Medications  Medication Sig Dispense Refill   apixaban (ELIQUIS) 5 MG TABS tablet Take 5 mg by mouth daily.     predniSONE (STERAPRED UNI-PAK 21 TAB) 5 MG (21) TBPK tablet Take 5 mg by mouth daily.     adalimumab (HUMIRA) 40 MG/0.4ML pen Inject 0.4 mLs (40 mg total) into the skin every 14 (fourteen) days. (Patient not taking: Reported on 04/27/2023) 2 each 0   SUMAtriptan (IMITREX) 50 MG tablet Take 1 tablet (50 mg total) by mouth daily as needed for migraine or headache. May repeat in 2 hours if headache persists or recurs. (Patient not taking: Reported on 04/27/2023) 10 tablet 0   No current facility-administered medications for this visit.  Allergies:   Aspirin, Other, Dilaudid [hydromorphone hcl], Morphine and codeine, and Remicade [infliximab]   Social History:  The patient  reports that he has been smoking cigarettes. He started smoking about 13 years ago. He has a 13 pack-year smoking history. He has never used smokeless tobacco. He reports current alcohol use. He reports current drug use. Drug: Marijuana.    Family History:   family history includes Healthy in his brother, father, and mother.    Review of Systems: Review of Systems  Constitutional: Negative.   HENT: Negative.    Respiratory:  Positive for shortness of breath.   Cardiovascular:  Positive for palpitations.  Gastrointestinal: Negative.   Musculoskeletal: Negative.   Neurological: Negative.   Psychiatric/Behavioral: Negative.    All other systems reviewed and are negative.    PHYSICAL EXAM: VS:  BP 108/60 (BP Location: Right Arm, Patient Position: Sitting, Cuff Size: Normal)   Pulse 83   SpO2 97%  , BMI There is no height or weight on file to calculate BMI. GEN: Well nourished, well developed, in no acute distress HEENT: normal Neck: no JVD, carotid bruits, or masses Cardiac: RRR; no murmurs, rubs, or gallops,no edema  Respiratory:  clear to auscultation bilaterally, normal work of breathing GI: soft, nontender, nondistended, + BS MS: no deformity or atrophy Skin: warm and dry, no rash Neuro:  Strength and sensation are intact Psych: euthymic mood, full affect   Recent Labs: 11/16/2022: Hemoglobin 15.1; Platelets 280 11/17/2022: Magnesium 2.4; TSH 0.432 03/22/2023: ALT 32; BUN 16; Creatinine, Ser 1.24; Potassium 4.6; Sodium 145    Lipid Panel Lab Results  Component Value Date   CHOL 192 03/22/2023   HDL 56 03/22/2023   LDLCALC 123 (H) 03/22/2023   TRIG 68 03/22/2023      Wt Readings from Last 3 Encounters:  04/27/23 174 lb (78.9 kg)  04/27/23 174 lb 4 oz (79 kg)  03/22/23 168 lb 11.2 oz (76.5 kg)       ASSESSMENT AND PLAN:  Problem List Items Addressed This Visit       Cardiology Problems   New onset atrial flutter (HCC) - Primary   Relevant Medications   apixaban (ELIQUIS) 5 MG TABS tablet   Other Relevant Orders   EKG 12-Lead (Completed)     Other   Tobacco dependence (Chronic)   Crohn's disease of colon (HCC)   atrial flutter, typical Seen in the hospital September 2024 for atrial  flutter started on Eliquis at that time Presents today for the first time since hospital discharge, remains in atrial flutter with symptoms of palpitations, shortness of breath on exertion -We have recommended he hold Eliquis 2 days prior to upcoming colonoscopy Restart Eliquis when cleared by GI Once he has had 30 days back on Eliquis, we can plan for cardioversion He is declining TEE cardioversion at an earlier date in an effort to avoid the TEE Recommended propranolol 10 up to 20 mg 3 times daily as needed for palpitations  Crohn's disease of colon Acceptable risk for colonoscopy February 28 with GI Recommended he stop Eliquis 2 days prior to colonoscopy, restart when cleared by GI, likely 24 to 48 hours later depending on biopsies No further cardiac workup needed prior to procedure For elevated ventricular rate during procedure, would give Lopressor IV  Smoker We have encouraged him to continue to work on weaning his cigarettes and smoking cessation. He will continue to work on this and does not want any assistance with chantix.  Signed, Dossie Arbour, M.D., Ph.D. [facility] [place], [place] [phone]

## 2023-04-28 NOTE — Telephone Encounter (Signed)
   Patient Name: Marcus Gordon  DOB: May 12, 1985 MRN: [id number]  Primary Cardiologist: None  Clinical pharmacists have reviewed the patient's past medical history, labs, and current medications as part of preoperative protocol coverage. The following recommendations have been made:  Patient with diagnosis of atrial fibrillation on Eliquis for anticoagulation.     What type of surgery is being performed? Colonoscopy     When is this surgery scheduled? 05/05/2023      CHA2DS2-VASc Score = 0   This indicates a 0.2% annual risk of stroke. The patient's score is based upon: CHF History: 0 HTN History: 0 Diabetes History: 0 Stroke History: 0 Vascular Disease History: 0 Age Score: 0 Gender Score: 0        CrCl 91 Platelet count 308   Per office protocol, patient can hold Eliquis for 2 days prior to procedure.   Patient will not need bridging with Lovenox (enoxaparin) around procedure.       I will route this recommendation to the requesting party via Epic fax function and remove from pre-op pool.  Please call with questions.  Joni Reining, NP 04/28/2023, 3:43 PM

## 2023-05-01 NOTE — Telephone Encounter (Signed)
 Patient verbalized understanding of instructions about stopping his blood thinner

## 2023-05-05 ENCOUNTER — Encounter
Admission: RE | Disposition: A | Discharge status: Home / Self Care | Payer: Self-pay | Source: Home / Self Care | Attending: Gastroenterology

## 2023-05-05 ENCOUNTER — Encounter: Payer: Self-pay | Admitting: Gastroenterology

## 2023-05-05 ENCOUNTER — Ambulatory Visit: Payer: Medicaid Other | Admitting: Anesthesiology

## 2023-05-05 ENCOUNTER — Ambulatory Visit
Admission: RE | Admit: 2023-05-05 | Discharge: 2023-05-05 | Disposition: A | Discharge status: Home / Self Care | Payer: Medicaid Other | Attending: Gastroenterology | Admitting: Gastroenterology

## 2023-05-05 ENCOUNTER — Other Ambulatory Visit: Payer: Self-pay

## 2023-05-05 DIAGNOSIS — F17210 Nicotine dependence, cigarettes, uncomplicated: Secondary | ICD-10-CM | POA: Insufficient documentation

## 2023-05-05 DIAGNOSIS — K529 Noninfective gastroenteritis and colitis, unspecified: Secondary | ICD-10-CM | POA: Diagnosis not present

## 2023-05-05 DIAGNOSIS — K50811 Crohn's disease of both small and large intestine with rectal bleeding: Secondary | ICD-10-CM

## 2023-05-05 DIAGNOSIS — G43909 Migraine, unspecified, not intractable, without status migrainosus: Secondary | ICD-10-CM | POA: Insufficient documentation

## 2023-05-05 DIAGNOSIS — K6289 Other specified diseases of anus and rectum: Secondary | ICD-10-CM | POA: Diagnosis not present

## 2023-05-05 DIAGNOSIS — Z09 Encounter for follow-up examination after completed treatment for conditions other than malignant neoplasm: Secondary | ICD-10-CM | POA: Diagnosis present

## 2023-05-05 DIAGNOSIS — K5080 Crohn's disease of both small and large intestine without complications: Secondary | ICD-10-CM | POA: Insufficient documentation

## 2023-05-05 DIAGNOSIS — K633 Ulcer of intestine: Secondary | ICD-10-CM | POA: Diagnosis not present

## 2023-05-05 HISTORY — DX: Cannabis use, unspecified, uncomplicated: F12.90

## 2023-05-05 HISTORY — DX: Crohn's disease of both small and large intestine with rectal bleeding: K50.811

## 2023-05-05 HISTORY — DX: Migraine, unspecified, not intractable, without status migrainosus: G43.909

## 2023-05-05 HISTORY — DX: Nonrheumatic mitral (valve) insufficiency: I34.0

## 2023-05-05 HISTORY — DX: Tobacco use: Z72.0

## 2023-05-05 HISTORY — DX: Other specified cardiac arrhythmias: I49.8

## 2023-05-05 HISTORY — PX: COLONOSCOPY WITH PROPOFOL: SHX5780

## 2023-05-05 HISTORY — DX: Rheumatic tricuspid insufficiency: I07.1

## 2023-05-05 HISTORY — DX: Unspecified atrial flutter: I48.92

## 2023-05-05 SURGERY — COLONOSCOPY WITH PROPOFOL
Anesthesia: General | Site: Rectum

## 2023-05-05 MED ORDER — PROPOFOL 10 MG/ML IV BOLUS
INTRAVENOUS | Status: DC | PRN
  Administered 2023-05-05 (×2): 20 mg via INTRAVENOUS
  Administered 2023-05-05 (×2): 50 mg via INTRAVENOUS
  Administered 2023-05-05: 20 mg via INTRAVENOUS
  Administered 2023-05-05 (×2): 50 mg via INTRAVENOUS

## 2023-05-05 MED ORDER — LIDOCAINE HCL (PF) 2 % IJ SOLN
INTRAMUSCULAR | Status: AC
  Filled 2023-05-05: qty 5

## 2023-05-05 MED ORDER — PROPOFOL 10 MG/ML IV BOLUS
INTRAVENOUS | Status: AC
  Filled 2023-05-05: qty 20

## 2023-05-05 MED ORDER — LACTATED RINGERS IV SOLN: INTRAVENOUS | Status: DC

## 2023-05-05 MED ORDER — SODIUM CHLORIDE 0.9 % IV SOLN: INTRAVENOUS | Status: DC

## 2023-05-05 MED ORDER — STERILE WATER FOR IRRIGATION IR SOLN
Status: DC | PRN
  Administered 2023-05-05: 1

## 2023-05-05 MED ORDER — PROPOFOL 10 MG/ML IV BOLUS
INTRAVENOUS | Status: AC
Start: 2023-05-05 — End: ?
  Filled 2023-05-05: qty 20

## 2023-05-05 SURGICAL SUPPLY — 21 items
CLIP HMST 235XBRD CATH ROT (MISCELLANEOUS) IMPLANT
ELECT REM PT RETURN 9FT ADLT (ELECTROSURGICAL) IMPLANT
ELECTRODE REM PT RTRN 9FT ADLT (ELECTROSURGICAL) IMPLANT
FCP ESCP3.2XJMB 240X2.8X (MISCELLANEOUS) IMPLANT
FORCEPS BIOP RAD 4 LRG CAP 4 (CUTTING FORCEPS) IMPLANT
FORCEPS ESCP3.2XJMB 240X2.8X (MISCELLANEOUS) IMPLANT
GOWN CVR UNV OPN BCK APRN NK (MISCELLANEOUS) ×2 IMPLANT
INJECTOR VARIJECT VIN23 (MISCELLANEOUS) IMPLANT
KIT DEFENDO VALVE AND CONN (KITS) IMPLANT
KIT PRC NS LF DISP ENDO (KITS) ×1 IMPLANT
MANIFOLD NEPTUNE II (INSTRUMENTS) ×1 IMPLANT
MARKER SPOT ENDO TATTOO 5ML (MISCELLANEOUS) IMPLANT
PROBE APC STR FIRE (PROBE) IMPLANT
RETRIEVER NET ROTH 2.5X230 LF (MISCELLANEOUS) IMPLANT
SNARE COLD EXACTO (MISCELLANEOUS) IMPLANT
SNARE SHORT THROW 13M SML OVAL (MISCELLANEOUS) IMPLANT
SNARE SHORT THROW 30M LRG OVAL (MISCELLANEOUS) IMPLANT
SNARE SNG USE RND 15MM (INSTRUMENTS) IMPLANT
TRAP ETRAP POLY (MISCELLANEOUS) IMPLANT
VARIJECT INJECTOR VIN23 (MISCELLANEOUS) IMPLANT
WATER STERILE IRR 250ML POUR (IV SOLUTION) ×1 IMPLANT

## 2023-05-05 NOTE — Op Note (Signed)
 [facility] Patient Name: Marcus Gordon Procedure Date: 05/05/2023 10:50 AM MRN: [id number] Account #: [id number] Date of Birth: 08/16/1985 Admit Type: Outpatient Age: 38 Room: [facility] OR ROOM 01 Gender: Male Note Status: Finalized Instrument Name: [id number] Procedure:             Colonoscopy Indications:           Last colonoscopy: June 2017, Follow-up of Crohn's                         disease of the small bowel and colon, Disease activity                         assessment of Crohn's disease of the small bowel and                         colon Providers:             Toney Reil MD, MD Referring MD:          Toney Reil MD, MD (Referring MD), Demetrios Isaacs.                         Sherrie Mustache, MD (Referring MD) Medicines:             General Anesthesia Complications:         No immediate complications. Estimated blood loss: None. Procedure:             Pre-Anesthesia Assessment:                        - Prior to the procedure, a History and Physical was                         performed, and patient medications and allergies were                         reviewed. The patient is competent. The risks and                         benefits of the procedure and the sedation options and                         risks were discussed with the patient. All questions                         were answered and informed consent was obtained.                         Patient identification and proposed procedure were                         verified by the physician, the nurse, the                         anesthesiologist, the anesthetist and the technician                         in the pre-procedure area in the procedure room in the  endoscopy suite. Mental Status Examination: alert and                         oriented. Airway Examination: normal oropharyngeal                         airway and neck mobility. Respiratory Examination:                          clear to auscultation. CV Examination: normal.                         Prophylactic Antibiotics: The patient does not require                         prophylactic antibiotics. Prior Anticoagulants: The                         patient has taken no anticoagulant or antiplatelet                         agents. ASA Grade Assessment: III - A patient with                         severe systemic disease. After reviewing the risks and                         benefits, the patient was deemed in satisfactory                         condition to undergo the procedure. The anesthesia                         plan was to use general anesthesia. Immediately prior                         to administration of medications, the patient was                         re-assessed for adequacy to receive sedatives. The                         heart rate, respiratory rate, oxygen saturations,                         blood pressure, adequacy of pulmonary ventilation, and                         response to care were monitored throughout the                         procedure. The physical status of the patient was                         re-assessed after the procedure.                        After obtaining informed consent, the colonoscope was  passed under direct vision. Throughout the procedure,                         the patient's blood pressure, pulse, and oxygen                         saturations were monitored continuously. The                         Colonoscope was introduced through the anus and                         advanced to the the terminal ileum, with                         identification of the appendiceal orifice and IC                         valve. The colonoscopy was performed without                         difficulty. The patient tolerated the procedure well.                         The quality of the bowel preparation was good. The                          terminal ileum, ileocecal valve, appendiceal orifice,                         and rectum were photographed. Findings:      The perianal and digital rectal examinations were normal. Pertinent       negatives include normal sphincter tone and no palpable rectal lesions.      The terminal ileum appeared normal. Biopsies were taken with a cold       forceps for histology.      A single (solitary) ulcer was found at the ileocecal valve. No bleeding       was present. No stigmata of recent bleeding were seen.      Diffuse mild inflammation characterized by congestion (edema), erythema       and friability was found in the entire colon. Biopsies were taken with a       cold forceps for histology.      The retroflexed view of the distal rectum and anal verge was normal and       showed no anal or rectal abnormalities. Impression:            - The examined portion of the ileum was normal.                         Biopsied.                        - A single (solitary) ulcer at the ileocecal valve.                        - Diffuse mild inflammation was found in the entire  examined colon secondary to Crohn's disease with                         colonic involvement. Biopsied.                        - The distal rectum and anal verge are normal on                         retroflexion view. Recommendation:        - Discharge patient to home (with escort).                        - Resume previous diet today.                        - Continue present medications.                        - Await pathology results.                        - Return to my office as previously scheduled. Procedure Code(s):     --- Professional ---                        [phone], Colonoscopy, flexible; with biopsy, single or                         multiple Diagnosis Code(s):     --- Professional ---                        K63.3, Ulcer of intestine                        K50.10, Crohn's disease  of large intestine without                         complications                        K50.80, Crohn's disease of both small and large                         intestine without complications CPT copyright 2022 [employer]. All rights reserved. The codes documented in this report are preliminary and upon coder review may  be revised to meet current compliance requirements. Dr. Libby Maw Toney Reil MD, MD 05/05/2023 11:31:44 AM This report has been signed electronically. Number of Addenda: 0 Note Initiated On: 05/05/2023 10:50 AM Scope Withdrawal Time: 0 hours 12 minutes 27 seconds  Total Procedure Duration: 0 hours 18 minutes 40 seconds  Estimated Blood Loss:  Estimated blood loss: none.      [facility]

## 2023-05-05 NOTE — H&P (Signed)
 Arlyss Repress, MD [address]  [address]  [place], [place] [address]  Main: [phone]  Fax: [phone] Pager: [phone]  Primary Care Physician:  [name], MD Primary Gastroenterologist:  Dr. Arlyss Repress  Pre-Procedure History & Physical: HPI:  Marcus Gordon is a 38 y.o. male is here for an colonoscopy.   Past Medical History:  Diagnosis Date   Arthritis    hips and legs   Asthma    childhood   Atrial flutter (HCC)    Crohn's disease (HCC)    External hemorrhoids    Fluttering heart    GERD (gastroesophageal reflux disease)    Marijuana use    Migraine headache    daily   Mild mitral regurgitation by prior echocardiogram    Moderate tricuspid regurgitation by prior echocardiogram    Tobacco abuse     Past Surgical History:  Procedure Laterality Date   COLONOSCOPY     COLONOSCOPY WITH PROPOFOL N/A 08/14/2015   Procedure: COLONOSCOPY WITH PROPOFOL;  Surgeon: Midge Minium, MD;  Location: [facility];  Service: Endoscopy;  Laterality: N/A;   POLYPECTOMY  08/14/2015   Procedure: POLYPECTOMY;  Surgeon: Midge Minium, MD;  Location: [facility];  Service: Endoscopy;;    Prior to Admission medications   Medication Sig Start Date End Date Taking? Authorizing Provider  apixaban (ELIQUIS) 5 MG TABS tablet Take 5 mg by mouth daily.   Yes [provider]  predniSONE (STERAPRED UNI-PAK 21 TAB) 5 MG (21) TBPK tablet Take 5 mg by mouth daily.   Yes [provider]  propranolol (INDERAL) 20 MG tablet Take 1 tablet (20 mg total) by mouth 3 (three) times daily as needed (As needed for palpitations). 04/28/23  Yes Gollan, Tollie Pizza, MD  SUMAtriptan (IMITREX) 50 MG tablet Take 1 tablet (50 mg total) by mouth daily as needed for migraine or headache. May repeat in 2 hours if headache persists or recurs. 11/17/22  Yes Jonah Blue, MD  adalimumab (HUMIRA) 40 MG/0.4ML pen Inject 0.4 mLs (40 mg total) into the skin every 14  (fourteen) days. Patient not taking: Reported on 04/27/2023 03/22/23   [name], MD    Allergies as of 04/27/2023 - Review Complete 04/27/2023  Allergen Reaction Noted   Aspirin  02/06/2015   Other  02/06/2015   Dilaudid [hydromorphone hcl] Rash 02/06/2015   Morphine and codeine Rash 02/06/2015   Remicade [infliximab] Rash 02/06/2015    Family History  Problem Relation Age of Onset   Healthy Mother    Healthy Father    Healthy Brother     Social History   Socioeconomic History   Marital status: Single    Spouse name: Not on file   Number of children: Not on file   Years of education: Not on file   Highest education level: Not on file  Occupational History   Not on file  Tobacco Use   Smoking status: Some Days    Current packs/day: 0.05    Average packs/day: 1 pack/day for 13.5 years (13.0 ttl pk-yrs)    Types: Cigarettes    Start date: 11/15/2009   Smokeless tobacco: Never  Vaping Use   Vaping status: Never Used  Substance and Sexual Activity   Alcohol use: Yes    Comment: occ   Drug use: Yes    Types: Marijuana    Comment: smokes daily   Sexual activity: Not on file  Other Topics Concern   Not on file  Social History Narrative   Not on file   Social Drivers of Health   Financial Resource Strain: Not on file  Food Insecurity: Not on file  Transportation Needs: Not on file  Physical Activity: Not on file  Stress: Not on file  Social Connections: Not on file  Intimate Partner Violence: Not on file    Review of Systems: See HPI, otherwise negative ROS  Physical Exam: BP 120/78   Temp 97.9 F (36.6 C) (Temporal)   Resp 18   Ht 6' 0.01" (1.829 m)   Wt 76.1 kg   SpO2 100%   BMI 22.75 kg/m  General:   Alert,  pleasant and cooperative in NAD Head:  Normocephalic and atraumatic. Neck:  Supple; no masses or thyromegaly. Lungs:  Clear throughout to auscultation.    Heart:  Regular rate and rhythm. Abdomen:  Soft, nontender and nondistended.  Normal bowel sounds, without guarding, and without rebound.   Neurologic:  Alert and  oriented x4;  grossly normal neurologically.  Impression/Plan: Marcus Gordon is here for an colonoscopy to be performed for h/o crohn's disease  Risks, benefits, limitations, and alternatives regarding  colonoscopy have been reviewed with the patient.  Questions have been answered.  All parties agreeable.   Lannette Donath, MD  05/05/2023, 10:20 AM

## 2023-05-05 NOTE — Transfer of Care (Signed)
 Immediate Anesthesia Transfer of Care Note  Patient: Marcus Gordon  Procedure(s) Performed: COLONOSCOPY WITH PROPOFOL (Rectum)  Patient Location: PACU  Anesthesia Type: General  Level of Consciousness: awake, alert  and patient cooperative  Airway and Oxygen Therapy: Patient Spontanous Breathing and Patient connected to supplemental oxygen  Post-op Assessment: Post-op Vital signs reviewed, Patient's Cardiovascular Status Stable, Respiratory Function Stable, Patent Airway and No signs of Nausea or vomiting  Post-op Vital Signs: Reviewed and stable  Complications: No notable events documented.

## 2023-05-05 NOTE — Anesthesia Postprocedure Evaluation (Signed)
 Anesthesia Post Note  Patient: Marcus Gordon  Procedure(s) Performed: COLONOSCOPY WITH PROPOFOL (Rectum)  Patient location during evaluation: PACU Anesthesia Type: General Level of consciousness: awake and alert Pain management: pain level controlled Vital Signs Assessment: post-procedure vital signs reviewed and stable Respiratory status: spontaneous breathing, nonlabored ventilation, respiratory function stable and patient connected to nasal cannula oxygen Cardiovascular status: blood pressure returned to baseline and stable Postop Assessment: no apparent nausea or vomiting Anesthetic complications: no   No notable events documented.   Last Vitals:  Vitals:   05/05/23 1143 05/05/23 1145  BP: (!) 117/90 121/80  Pulse: 76 87  Resp: 16 16  Temp:  36.5 C  SpO2: 100% 100%    Last Pain:  Vitals:   05/05/23 1145  TempSrc:   PainSc: 0-No pain                 Hamdi Kley C Le Faulcon

## 2023-05-09 ENCOUNTER — Telehealth: Payer: Self-pay

## 2023-05-09 DIAGNOSIS — K5100 Ulcerative (chronic) pancolitis without complications: Secondary | ICD-10-CM

## 2023-05-09 LAB — SURGICAL PATHOLOGY

## 2023-05-09 NOTE — Telephone Encounter (Signed)
 What diagnosis  do I use for these labs

## 2023-05-09 NOTE — Telephone Encounter (Signed)
-----   Message from [facility] sent at 05/09/2023  4:00 PM EST ----- Please inform patient that the pathology results from recent colonoscopy confirms mild chronic active colitis, it appears more like ulcerative colitis rather than Crohn's disease.  However, management will not change Recommend to apply for Humira as a new start Check QuantiFERON gold, hepatitis B surface antigen and hepatitis B core antibody total, hepatitis C antibody  Lannette Donath, MD

## 2023-05-09 NOTE — Telephone Encounter (Signed)
Order the lab work

## 2023-05-10 NOTE — Telephone Encounter (Signed)
 Called patient and patient verbalized understanding of results. States he will try to go get lab work today. Filled out application for for [facility] for Humira and faxed it in. Emailed Armed forces training and education officer with [employer] [facility] to let him know the new start form was heading his way.

## 2023-05-12 NOTE — Telephone Encounter (Signed)
 Insurance company approved the [employer] from 05/10/2023 to 05/09/2024

## 2023-05-17 ENCOUNTER — Ambulatory Visit: Payer: Self-pay | Admitting: Gastroenterology

## 2023-05-17 NOTE — Telephone Encounter (Signed)
 Called patient to remind him he needed to go for labs again. He states he will try to go for labs this week

## 2023-05-21 NOTE — Progress Notes (Unsigned)
 Cardiology Office Note  Date:  05/22/2023   ID:  Marcus Gordon, Marcus Gordon [birth year], MRN [id number]  PCP:  [name], MD   Chief Complaint  Patient presents with   1 month follow up     Patient c/o shortness of breath with racing heart beats.     HPI:  [name] is a 38 year old gentleman with history of  Crohn's disease,  migraines,  smoker since age 53,  GERD  presenting with to the hospital November 17, 2022 with migraine and shortness of breath  Found to have atrial flutter on EKG Who presents for follow-up of his atrial flutter  Last seen by myself in clinic February 2025 Recent colonoscopy May 05, 2023, stopped Eliquis 2 days prior Restarted Eliquis March 1, reports only taking Eliquis once a day as when he takes it twice a day he gets GI bleeding  Reports having lab work scheduled and based on those lab work will be starting Humira which should help with his GI bleeding  Reports breathing is stable but not normal, Appreciates palpitations Eager to get back to normal sinus rhythm  EKG personally reviewed by myself on todays visit EKG Interpretation Date/Time:  Monday May 22 2023 11:01:33 EDT Ventricular Rate:  71 PR Interval:    QRS Duration:  100 QT Interval:  380 QTC Calculation: 412 R Axis:   41  Text Interpretation: Atrial flutter with variable A-V block When compared with ECG of 28-Apr-2023 10:20, No significant change was found Confirmed by [name] [phone]) on 05/22/2023 11:13:17 AM   Other past medical history reviewed In September 2024, he was in rate controlled flutter, timing of onset felt to be several months earlier given waxing waning symptoms of shortness of breath and palpitations Echo with normal LV function, moderate TR, dilated RV and RA Was started on Eliquis 5 twice daily Coupon provided, he had no insurance It was recommended he follow-up with [facility] Plan was for cardioversion  after 1 month on anticoagulation  colonoscopy May 05, 2023  Seen by GI yesterday April 27, 2023 Flareup of Crohn's symptoms January 2025  bloody diarrhea, weight loss, loss of appetite, along with inflammatory arthritis.  He was seen by Dr. Angie Fava, [facility] rheumatologist who started him on prednisone.     PMH:   has a past medical history of Arthritis, Asthma, Atrial flutter (HCC), Crohn's disease (HCC), External hemorrhoids, Fluttering heart, GERD (gastroesophageal reflux disease), Marijuana use, Migraine headache, Mild mitral regurgitation by prior echocardiogram, Moderate tricuspid regurgitation by prior echocardiogram, and Tobacco abuse.  PSH:    Past Surgical History:  Procedure Laterality Date   COLONOSCOPY     COLONOSCOPY WITH PROPOFOL N/A 08/14/2015   Procedure: COLONOSCOPY WITH PROPOFOL;  Surgeon: Midge Minium, MD;  Location: [facility];  Service: Endoscopy;  Laterality: N/A;   COLONOSCOPY WITH PROPOFOL N/A 05/05/2023   Procedure: COLONOSCOPY WITH PROPOFOL;  Surgeon: Toney Reil, MD;  Location: [facility];  Service: Endoscopy;  Laterality: N/A;   POLYPECTOMY  08/14/2015   Procedure: POLYPECTOMY;  Surgeon: Midge Minium, MD;  Location: [facility];  Service: Endoscopy;;    Current Outpatient Medications  Medication Sig Dispense Refill   apixaban (ELIQUIS) 5 MG TABS tablet Take 5 mg by mouth daily.     propranolol (INDERAL) 20 MG tablet Take 1 tablet (20 mg total) by mouth 3 (three) times daily as needed (As needed for palpitations). 270 tablet 3   SUMAtriptan (IMITREX)  50 MG tablet Take 1 tablet (50 mg total) by mouth daily as needed for migraine or headache. May repeat in 2 hours if headache persists or recurs. 10 tablet 0   predniSONE (STERAPRED UNI-PAK 21 TAB) 5 MG (21) TBPK tablet Take 5 mg by mouth daily. (Patient not taking: Reported on 05/22/2023)     No current facility-administered medications for this visit.    Allergies:    Aspirin, Other, Dilaudid [hydromorphone hcl], Morphine and codeine, and Remicade [infliximab]   Social History:  The patient  reports that he has been smoking cigarettes. He started smoking about 13 years ago. He has a 13 pack-year smoking history. He has never used smokeless tobacco. He reports current alcohol use. He reports current drug use. Drug: Marijuana.   Family History:   family history includes Healthy in his brother, father, and mother.    Review of Systems: Review of Systems  Constitutional: Negative.   HENT: Negative.    Respiratory:  Positive for shortness of breath.   Cardiovascular:  Positive for palpitations.  Gastrointestinal: Negative.   Musculoskeletal: Negative.   Neurological: Negative.   Psychiatric/Behavioral: Negative.    All other systems reviewed and are negative.   PHYSICAL EXAM: VS:  BP 104/70 (BP Location: Left Arm, Patient Position: Sitting, Cuff Size: Normal)   Pulse 71   Ht 6' (1.829 m)   Wt 171 lb 2 oz (77.6 kg)   SpO2 98%   BMI 23.21 kg/m  , BMI Body mass index is 23.21 kg/m. Constitutional:  oriented to person, place, and time. No distress.  HENT:  Head: Grossly normal Eyes:  no discharge. No scleral icterus.  Neck: No JVD, no carotid bruits  Cardiovascular: Irregularly irregular no murmurs appreciated Pulmonary/Chest: Clear to auscultation bilaterally, no wheezes or rails Abdominal: Soft.  no distension.  no tenderness.  Musculoskeletal: Normal range of motion Neurological:  normal muscle tone. Coordination normal. No atrophy Skin: Skin warm and dry Psychiatric: normal affect, pleasant  Recent Labs: 11/16/2022: Hemoglobin 15.1; Platelets 280 11/17/2022: Magnesium 2.4; TSH 0.432 03/22/2023: ALT 32; BUN 16; Creatinine, Ser 1.24; Potassium 4.6; Sodium 145    Lipid Panel Lab Results  Component Value Date   CHOL 192 03/22/2023   HDL 56 03/22/2023   LDLCALC 123 (H) 03/22/2023   TRIG 68 03/22/2023      Wt Readings from Last 3  Encounters:  05/22/23 171 lb 2 oz (77.6 kg)  05/05/23 167 lb 12.8 oz (76.1 kg)  04/27/23 174 lb 4 oz (79 kg)     ASSESSMENT AND PLAN:  Problem List Items Addressed This Visit       Cardiology Problems   New onset atrial flutter (HCC) - Primary   Relevant Orders   EKG 12-Lead (Completed)   Cardiomegaly   Relevant Orders   EKG 12-Lead (Completed)     Other   Tobacco dependence (Chronic)   Chest pain   Other Visit Diagnoses       SOB (shortness of breath)       Relevant Orders   EKG 12-Lead (Completed)       atrial flutter, typical Seen in the hospital September 2024 for atrial flutter started on Eliquis at that time Presented in February prior to colonoscopy remains in atrial flutter He held Eliquis 2 days prior to colonoscopy, reports restarting Eliquis March 1 once a day following colonoscopy  Reports if he takes Eliquis twice a day he has GI bleeding though this may improve once he takes [employer] work  scheduled and depending on these labs will be started on Humira We have recommended he start on Humira and once tolerating Eliquis 5 mg twice daily for at least 3 weeks straight uninterrupted we could schedule cardioversion He will call us back once he is tolerating Eliquis 5 twice daily and we can schedule cardioversion  Crohn's disease of colon Managed by GI, recent colonoscopy Reports he is scheduled to start Humira given GI bleeding  Smoker We have encouraged him to continue to work on weaning his cigarettes and smoking cessation. He will continue to work on this and does not want any assistance with chantix.     Signed, Dossie Arbour, M.D., Ph.D. [facility] [place], [place] [phone]

## 2023-05-22 ENCOUNTER — Ambulatory Visit: Payer: Medicaid Other | Attending: Cardiovascular Disease | Admitting: Cardiovascular Disease

## 2023-05-22 ENCOUNTER — Encounter: Payer: Self-pay | Admitting: Gastroenterology

## 2023-05-22 ENCOUNTER — Telehealth: Payer: Self-pay | Admitting: Gastroenterology

## 2023-05-22 ENCOUNTER — Encounter: Payer: Self-pay | Admitting: Cardiovascular Disease

## 2023-05-22 VITALS — BP 104/70 | HR 71 | Ht 72.0 in | Wt 171.1 lb

## 2023-05-22 DIAGNOSIS — I4892 Unspecified atrial flutter: Secondary | ICD-10-CM | POA: Diagnosis not present

## 2023-05-22 DIAGNOSIS — I517 Cardiomegaly: Secondary | ICD-10-CM

## 2023-05-22 DIAGNOSIS — F17200 Nicotine dependence, unspecified, uncomplicated: Secondary | ICD-10-CM

## 2023-05-22 DIAGNOSIS — R0602 Shortness of breath: Secondary | ICD-10-CM

## 2023-05-22 DIAGNOSIS — R079 Chest pain, unspecified: Secondary | ICD-10-CM | POA: Diagnosis not present

## 2023-05-22 MED ORDER — PREDNISONE 10 MG PO TABS: 20.0000 mg | ORAL_TABLET | Freq: Every day | ORAL | 0 refills | Status: AC

## 2023-05-22 MED ORDER — APIXABAN 5 MG PO TABS: 5.0000 mg | ORAL_TABLET | Freq: Two times a day (BID) | ORAL | 0 refills | Status: DC

## 2023-05-22 NOTE — Telephone Encounter (Signed)
 Pt requesting call back to discuss arternative to getting his labs done through [facility] because of a 1.000.00 balance.

## 2023-05-22 NOTE — Patient Instructions (Addendum)
 Eliquis samples  Medication Instructions:  No changes  If you need a refill on your cardiac medications before your next appointment, please call your pharmacy.   Lab work: No new labs needed  Testing/Procedures: Please call the office when you have been taking Eliquis 5 MG twice daily for 2 weeks to schedule a Cardioversion.   Follow-Up: At [facility], you and your health needs are our priority.  As part of our continuing mission to provide you with exceptional heart care, we have created designated Provider Care Teams.  These Care Teams include your primary Cardiologist (physician) and Advanced Practice Providers (APPs -  Physician Assistants and Nurse Practitioners) who all work together to provide you with the care you need, when you need it.  Follow up 3 months  Providers on your designated Care Team:   Nicolasa Ducking, NP Eula Listen, PA-C Cadence Fransico Michael, [place]  COVID-19 Vaccine Information can be found at: [email or web address] For questions related to vaccine distribution or appointments, please email [email or web address] or call [phone].

## 2023-05-22 NOTE — Telephone Encounter (Signed)
 Informed patient he could go to the [facility] to have the lab work done. He verbalized understanding

## 2023-05-24 ENCOUNTER — Other Ambulatory Visit
Admission: RE | Admit: 2023-05-24 | Discharge: 2023-05-24 | Disposition: A | Discharge status: Home / Self Care | Source: Ambulatory Visit | Attending: Gastroenterology | Admitting: Gastroenterology

## 2023-05-24 DIAGNOSIS — K5100 Ulcerative (chronic) pancolitis without complications: Secondary | ICD-10-CM | POA: Diagnosis present

## 2023-05-24 LAB — HEPATITIS C ANTIBODY: HCV Ab: NONREACTIVE

## 2023-05-24 LAB — HEPATITIS B SURFACE ANTIGEN: Hepatitis B Surface Ag: NONREACTIVE

## 2023-05-25 ENCOUNTER — Telehealth: Payer: Self-pay

## 2023-05-25 LAB — HEPATITIS B CORE ANTIBODY, TOTAL: HEP B CORE AB: NEGATIVE

## 2023-05-25 NOTE — Telephone Encounter (Signed)
 Paper work has already been done and sent to [employer] and has been approved just waiting on blood work to shipped

## 2023-05-25 NOTE — Telephone Encounter (Signed)
-----   Message from [facility] sent at 05/25/2023  4:30 PM EDT ----- Go ahead and start paperwork for Humira fresh start, urgent Waiting on QuantiFERON gold results  RV

## 2023-05-26 ENCOUNTER — Ambulatory Visit: Payer: Self-pay | Admitting: Cardiology

## 2023-05-29 LAB — QUANTIFERON-TB GOLD PLUS (RQFGPL)
QuantiFERON Mitogen Value: >10 [IU]/mL
QuantiFERON Nil Value: 0.06 [IU]/mL
QuantiFERON TB1 Ag Value: 0.06 [IU]/mL
QuantiFERON TB2 Ag Value: 0.06 [IU]/mL

## 2023-05-29 LAB — QUANTIFERON-TB GOLD PLUS: QuantiFERON-TB Gold Plus: NEGATIVE

## 2023-05-31 ENCOUNTER — Telehealth: Payer: Self-pay

## 2023-05-31 NOTE — Telephone Encounter (Signed)
-----   Message from [facility] sent at 05/30/2023  4:03 PM EDT ----- Quantiferon gold negative, ok to proceed with humira  RV

## 2023-05-31 NOTE — Telephone Encounter (Signed)
 Per Raiford Noble with [facility]: I hope this finds you having a great spring so far!  He is good to go for shipment with $4 copay.  Calls have gone out to him if you might be able to ask him to call us.  [phone].  You can fax [place] the pertinent lab work you got as well for his chart: [phone]. Thank you!  We are calling patient number ending [phone].  Faxed labs to [facility] and received confirmation fax went through.   Sent patient a [employer] geologist

## 2023-06-01 ENCOUNTER — Ambulatory Visit: Admit: 2023-06-01 | Admitting: Cardiovascular Disease

## 2023-06-01 SURGERY — CARDIOVERSION
Anesthesia: General

## 2023-06-26 ENCOUNTER — Ambulatory Visit: Payer: Medicaid Other | Admitting: Gastroenterology

## 2023-07-03 ENCOUNTER — Other Ambulatory Visit: Payer: Self-pay | Admitting: Emergency Medicine

## 2023-07-03 ENCOUNTER — Telehealth: Payer: Self-pay | Admitting: Cardiovascular Disease

## 2023-07-03 ENCOUNTER — Telehealth: Payer: Self-pay

## 2023-07-03 DIAGNOSIS — Z79899 Other long term (current) drug therapy: Secondary | ICD-10-CM

## 2023-07-03 NOTE — Telephone Encounter (Signed)
 See previous note

## 2023-07-03 NOTE — Telephone Encounter (Signed)
 Called and spoke with patient. Patient states that he has been taking Eliquis  two times daily for four weeks. Patient scheduled for Cardioversion on 07/12/23. Patient given verbal instructions and written instructions sent via MyChart.

## 2023-07-03 NOTE — Telephone Encounter (Signed)
Pt calling to schedule procedure.  Please advise.

## 2023-07-03 NOTE — Telephone Encounter (Signed)
 Called patient, advised that he wanted to reschedule his Cardioversion. Advised that he was scheduled back in March but it was cancelled.   Dr.Gollan do you want to go reschedule him for the procedure or have an office visit to discuss again?   Patient advises with me he has not missed any of his medication doses (Eliquis )   Advised patient we would call back with MD recommendations.   Thanks!

## 2023-07-07 ENCOUNTER — Telehealth: Payer: Self-pay | Admitting: *Deleted

## 2023-07-07 NOTE — Telephone Encounter (Signed)
 Spoke with patient and he has been taking his Eliquis  2 tablets once daily. Reviewed that he should be taking it twice a day. Inquired how long has he been taking the Eliquis  and he reports being on this medication for 1 month and 2 weeks. Advised that I would check with provider and will be in touch with his recommendations. He verbalized understanding with  no further questions at this time.

## 2023-07-10 ENCOUNTER — Other Ambulatory Visit
Admission: RE | Admit: 2023-07-10 | Discharge: 2023-07-10 | Disposition: A | Discharge status: Home / Self Care | Attending: Cardiovascular Disease | Admitting: Cardiovascular Disease

## 2023-07-10 DIAGNOSIS — Z79899 Other long term (current) drug therapy: Secondary | ICD-10-CM

## 2023-07-10 LAB — BASIC METABOLIC PANEL WITH GFR
Anion gap: 8 (ref 5–15)
BUN: 17 mg/dL (ref 6–20)
CO2: 22 mmol/L (ref 22–32)
Calcium: 8.8 mg/dL — ABNORMAL LOW (ref 8.9–10.3)
Chloride: 110 mmol/L (ref 98–111)
Creatinine, Ser: 1.12 mg/dL (ref 0.61–1.24)
GFR, Estimated: >60 mL/min (ref >60)
Glucose, Bld: 120 mg/dL — ABNORMAL HIGH (ref 70–99)
Potassium: 3.9 mmol/L (ref 3.5–5.1)
Sodium: 140 mmol/L (ref 135–145)

## 2023-07-10 LAB — CBC
HCT: 43 % (ref 39.0–52.0)
Hemoglobin: 14.6 g/dL (ref 13.0–17.0)
MCH: 32.7 pg (ref 26.0–34.0)
MCHC: 34 g/dL (ref 30.0–36.0)
MCV: 96.4 fL (ref 80.0–100.0)
Platelets: 223 10*3/uL (ref 150–400)
RBC: 4.46 MIL/uL (ref 4.22–5.81)
RDW: 12 % (ref 11.5–15.5)
WBC: 5.7 10*3/uL (ref 4.0–10.5)
nRBC: 0 % (ref 0.0–0.2)

## 2023-07-10 NOTE — Addendum Note (Signed)
 Addended by: Smrithi Pigford C on: 07/10/2023 09:08 AM   Modules accepted: Orders

## 2023-07-10 NOTE — Telephone Encounter (Signed)
 Called and spoke with patient. Patient's cardioversion is rescheduled for 07/28/23.

## 2023-07-10 NOTE — Telephone Encounter (Signed)
 See other telephone encounter.

## 2023-07-11 ENCOUNTER — Encounter: Payer: Self-pay | Admitting: Gastroenterology

## 2023-07-11 ENCOUNTER — Ambulatory Visit (INDEPENDENT_AMBULATORY_CARE_PROVIDER_SITE_OTHER): Admitting: Gastroenterology

## 2023-07-11 ENCOUNTER — Telehealth: Payer: Self-pay

## 2023-07-11 VITALS — BP 115/70 | HR 111 | Temp 97.6°F | Ht 72.0 in | Wt 170.4 lb

## 2023-07-11 DIAGNOSIS — K5080 Crohn's disease of both small and large intestine without complications: Secondary | ICD-10-CM | POA: Diagnosis not present

## 2023-07-11 DIAGNOSIS — K50811 Crohn's disease of both small and large intestine with rectal bleeding: Secondary | ICD-10-CM

## 2023-07-11 NOTE — Telephone Encounter (Signed)
-----   Message from [facility] sent at 07/11/2023  3:18 PM EDT ----- [facility] Called and states they need a PA on patient Humira  pen before they can refilled it

## 2023-07-11 NOTE — Progress Notes (Unsigned)
 Karma Oz, MD [address]  [address]  [place], [place] [address]  Main: [phone]  Fax: [phone]    Gastroenterology Consultation  Referring Provider:     Lamon Pillow, MD Primary Care Physician:  Lamon Pillow, MD Primary Gastroenterologist:  Dr. Ellis Guys Reason for Consultation: Crohn's disease        HPI:   Marcus Gordon is a 38 y.o. male referred by Dr. Lamon Pillow, MD  for consultation & management of Crohn's disease  Patient had a recent flareup of his Crohn's disease, went to ER on 3/22 secondary to abdominal pain, rectal bleeding, diarrhea as well as 18 pound weight loss associated with worsening fatigue and weakness, he is unable to eat or drink, also with reflux.  He is found to have significant leukocytosis, WBC 18.9, thrombocytosis, hemoglobin 13.6, CMP normal.  He underwent CT abdomen and pelvis which revealed thickening of the right colon.  He is discharged home on prednisone  40 mg with taper.  Patient was last seen by GI in 2017, at that time he underwent colonoscopy which revealed mild disease only.  He was maintained on biweekly Humira  until 8 months ago, lost his disability and is no longer able to afford  He has been on prednisone  40 mg for last 2 days, reports that his appetite is picking up slowly, he had 2 bowel movements today, rectal bleeding is improving, denies abdominal pain.  He denies any joint pains.  He denies fever, chills, nausea or vomiting  Follow-up visit 04/27/2023 Marcus Gordon was lost to follow-up for 3 years and finally came to see me today for his Crohn's disease.  He was diagnosed with no onset of atrial flutter in September 2024 when he presented to hospital with chest pain and shortness of breath.  He was discharged on apixaban  with the plan to follow-up with cardiology.  Patient has appointment with Dr. Gollan tomorrow.  He reports flareup of Crohn's symptoms in January 2025 including bloody diarrhea,  weight loss, loss of appetite, along with inflammatory arthritis.  He was seen by Dr. Defoor, [facility] clinic rheumatologist who started him on prednisone .  He was also discussed to switch from Humira  to Cimzia.  Since initiating prednisone , he reports that rectal bleeding has subsided, appetite improved, regaining weight, however having 3-4 bowel movements daily.  He is requesting reinitiation of Humira  or some other medication.  He did not undergo colonoscopy as recommended by me in the past to assess severity of his Crohn's disease.  He also states that he is smoking 1 to 2 cigarettes daily  With regards to Crohn's disease, he stopped taking Humira  in August 2024.  Apparently, patient was taking medications that belonged to someone else.  He was also receiving it previously through [employer] patient assistance program and he was rationing his injections.  Follow-up visit 07/11/2023 Marcus Gordon is here for follow-up of Crohn's disease.  He underwent colonoscopy on 05/05/2023 which revealed mild chronic active colitis.  Normal terminal ileum.  I have reinitiated his Humira  with induction and biweekly maintenance dose.  He completed induction doses and he reports feeling better.  He is currently off prednisone .  Apparently, he missed several calls from specialty pharmacy in order to confirm shipment of his maintenance doses.  I also showed him the letter sent by the [facility] pharmacy.  He does not have any concerns today.  He does vape, smokes marijuana.  He also admits to smoking tobacco  Patient  denies family history of IBD, GI malignancy  Crohn's disease classification:  Age: < 17 Location: ileocolonic Behavior: non stricturing, non penetrating  Perianal:  no  IBD diagnosis: At the age of 38  Disease course:Patient is diagnosed with Crohn's disease at age 38, initial presentation of rectal bleeding, diarrhea, abdominal pain and weight loss, he was initially on prednisone , then Remicade, patient reports  that he developed allergic reaction to it (hives all over his body), therefore switched to Humira  every other week.  Patient reports that he has been on Humira  until 8 months ago and stopped as he lost his insurance.  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 Biopsied. - A single ( solitary) ulcer at the ileocecal valve. - Diffuse mild inflammation was found in the entire examined colon secondary to Crohn' s disease with colonic involvement. Biopsied. - The distal rectum and anal verge are normal on retroflexion view. FINAL DIAGNOSIS        1. Terminal ileum, biopsy,  :       - UNREMARKABLE SMALL INTESTINAL MUCOSA.       - NEGATIVE FOR GRANULOMAS, DYSPLASIA, AND MALIGNANCY.        2. Right Colon Biopsy,  :       - PATCHY MILD CHRONIC ACTIVE COLITIS.       - NEGATIVE FOR GRANULOMAS, DYSPLASIA, AND MALIGNANCY.        3. Colon, biopsy, Left :       - PATCHY MILD CHRONIC ACTIVE COLITIS.       - NEGATIVE FOR GRANULOMAS, DYSPLASIA, AND MALIGNANCY.        4. Rectum, biopsy,  :       - MILD CHRONIC ACTIVE PROCTITIS.       - NEGATIVE FOR GRANULOMAS, DYSPLASIA, AND MALIGNANCY.   Colonoscopy 08/14/2015 - One 4 mm polyp in the descending colon, removed with a cold biopsy forceps. Resected and retrieved. - A few ulcers in the terminal ileum. Biopsied. - Mucosal ulceration. Biopsied. - Anal fissure found on digital rectal exam. - Non-bleeding internal hemorrhoids.    Colonoscopy 08/02/2012  The examined portion of the ileum was normal. Biopsied.-Inflammationwas found in the colon secondary to Crohn's Disease with colonic involvement . Non-bleeding internal hemorrhoids. Multiple biopsies were obtained.  Diagnosis:  Part A: COLD BIOPSY TERMINAL  ILEUM:  - SMALL BOWEL MUCOSA WITH PRESERVED VILLOUS ARCHITECTURE.  - NEGATIVE FOR INTRAEPITHELIAL LYMPHOCYTOSIS, DYSPLASIA AND  MALIGNANCY.  .  Part B: COLD BIOPSY RIGHT COLON:  - FOCAL ACTIVE COLITIS, SEE COMMENT.  - NEGATIVE FOR GRANULOMATA, INFECTIOUS PATHOGENS AND MALIGNANCY.  .  Part C: COLD BIOPSY LEFT COLON:  - NO SIGNIFICANT PATHOLOGIC CHANGES.  - NEGATIVE FOR INTRAEPITHELIAL LYMPHOCYTOSIS, DYSPLASIA AND  MALIGNANCY.    Upper Endoscopy none  VCE none  IBD medications:  Steroids: Responsive to prednisone  5-ASA: None  Immunomodulators: AZA, methotrexate nave TPMT status unknown Biologics:  Anti TNFs: Developed allergic reaction to Remicade.  Previously maintained on Humira  biweekly in clinical remission Anti Integrins: Ustekinumab: Tofactinib: Clinical trial:   NSAIDs: None  Antiplts/Anticoagulants/Anti thrombotics: None   Past Medical History:  Diagnosis Date   Arthritis    hips and legs   Asthma    childhood   Atrial flutter (HCC)    Crohn's disease (HCC)  External hemorrhoids    Fluttering heart    GERD (gastroesophageal reflux disease)    Marijuana use    Migraine headache    daily   Mild mitral regurgitation by prior echocardiogram    Moderate tricuspid regurgitation by prior echocardiogram    Tobacco abuse     Past Surgical History:  Procedure Laterality Date   COLONOSCOPY     COLONOSCOPY WITH PROPOFOL  N/A 08/14/2015   Procedure: COLONOSCOPY WITH PROPOFOL ;  Surgeon: Marnee Sink, MD;  Location: [facility];  Service: Endoscopy;  Laterality: N/A;   COLONOSCOPY WITH PROPOFOL  N/A 05/05/2023   Procedure: COLONOSCOPY WITH PROPOFOL ;  Surgeon: Selena Daily, MD;  Location: [facility];  Service: Endoscopy;  Laterality: N/A;   POLYPECTOMY  08/14/2015   Procedure: POLYPECTOMY;  Surgeon: Marnee Sink, MD;  Location: [facility];  Service: Endoscopy;;   Current Outpatient Medications:    adalimumab  (HUMIRA ) 80 MG/0.8ML  pen, Inject 80 mg as directed every other day., Disp: , Rfl:    apixaban  (ELIQUIS ) 5 MG TABS tablet, Take 1 tablet (5 mg total) by mouth 2 (two) times daily. (Patient taking differently: Take 10 mg by mouth daily.), Disp: 28 tablet, Rfl: 0   SUMAtriptan  (IMITREX ) 50 MG tablet, Take 1 tablet (50 mg total) by mouth daily as needed for migraine or headache. May repeat in 2 hours if headache persists or recurs. (Patient not taking: Reported on 07/05/2023), Disp: 10 tablet, Rfl: 0    Family History  Problem Relation Age of Onset   Healthy Mother    Healthy Father    Healthy Brother      Social History   Tobacco Use   Smoking status: Some Days    Current packs/day: 0.05    Average packs/day: 1 pack/day for 13.7 years (13.0 ttl pk-yrs)    Types: Cigarettes    Start date: 11/15/2009   Smokeless tobacco: Never  Vaping Use   Vaping status: Never Used  Substance Use Topics   Alcohol use: Yes    Comment: occ   Drug use: Yes    Types: Marijuana    Comment: smokes daily    Allergies as of 07/11/2023 - Review Complete 07/11/2023  Allergen Reaction Noted   Aspirin  02/06/2015   Other  02/06/2015   Dilaudid [hydromorphone hcl] Rash 02/06/2015   Morphine and codeine Rash 02/06/2015   Remicade [infliximab] Rash 02/06/2015    Review of Systems:    All systems reviewed and negative except where noted in HPI.   Physical Exam:  BP 115/70 (BP Location: Left Arm, Patient Position: Sitting, Cuff Size: Normal)   Pulse (!) 111   Temp 97.6 F (36.4 C) (Oral)   Ht 6' (1.829 m)   Wt 170 lb 6 oz (77.3 kg)   BMI 23.11 kg/m  No LMP for male patient.  General:   Alert, thin built, moderately nourished, pleasant and cooperative in NAD Head:  Normocephalic and atraumatic. Eyes:  Sclera clear, no icterus.   Conjunctiva pink. Ears:  Normal auditory acuity. Nose:  No deformity, discharge, or lesions. Mouth:  No deformity or lesions,oropharynx pink & moist. Neck:  Supple; no masses or  thyromegaly. Lungs:  Respirations even and unlabored.  Clear throughout to auscultation.   No wheezes, crackles, or rhonchi. No acute distress. Heart:  Regular rate and rhythm; no murmurs, clicks, rubs, or gallops. Abdomen:  Normal bowel sounds. Soft, non-tender and nondistended, without masses, hepatosplenomegaly or hernias noted.  No guarding or rebound tenderness.  Rectal: Not performed Msk:  Symmetrical without gross deformities. Good, equal movement & strength bilaterally. Pulses:  Normal pulses noted. Extremities:  No clubbing or edema.  No cyanosis. Neurologic:  Alert and oriented x3;  grossly normal neurologically. Skin:  Intact without significant lesions or rashes. No jaundice. Psych:  Alert and cooperative. Normal mood and affect.  Imaging Studies: CT abdomen and pelvis 05/26/2020 IMPRESSION: Mild-to-moderate colitis predominantly involving the ascending and transverse colon. No evidence of abscess or other complication.   Mild right lower quadrant and pericolonic mesenteric lymphadenopathy, likely reactive in etiology.   Tiny nonobstructing left renal calculus.  Assessment and Plan:   Marcus Gordon is a 38 y.o. African-American male with history of ileocolonic Crohn's disease diagnosed at age 89, nonstricturing nonpenetrating phenotype, sacroiliitis with spondyloarthropathy, allergic reaction to Remicade, previously maintained on biweekly Humira  monotherapy until 2021, then restarted by taking medication prescribed to somebody else and rationing it once a month through [employer] patient assistance program.  Last Humira  dose was in August 2024.  Has Crohn's exacerbation and inflammatory arthritis since discontinuation of Humira , progressively worsening symptoms s/p prednisone  started in 03/2023 with significant improvement in his symptoms  Flareup of Crohn's disease due to discontinuation of anti-TNF therapy Responded well to prednisone , currently in clinical  remission Most recent colonoscopy showed mild chronic active colitis Humira  reinitiated in 06/2023, finished induction doses.  Advised him to confirm shipment from the specialty pharmacy in order to have uninterrupted maintenance therapy Check CBC and LFTs every 3 months Discussed with him about importance of adherence to medication and follow-ups with GI  Discussed with him regarding immunization including hepatitis A, B as well as pneumonia vaccines and Shingrix vaccine, annual influenza vaccine Initially, patient was reluctant to get any vaccines.  However, after discussing the risks and benefits of vaccination in setting of anti-TNF therapy, he seemed to be convinced to get administered Recommendations provided on after visit summary  A-flutter, new onset diagnosed in 10/2022 Rate controlled, currently on Eliquis  Has appointment with Dr. Gollan tomorrow Will obtain cardiac clearance and clearance for interruption of Eliquis  for colonoscopy   Follow up in 3 to 4 months   Karma Oz, MD

## 2023-07-11 NOTE — Patient Instructions (Addendum)
 You Need to get these vaccines Twinrix, Pneumococcal 13, Pneumovax, Shingrix, and annual Flu vaccine.  Please get LFT and CBC in 3 months.  Please call [employer] [facility] to schedule your shipment of your medication at [phone].

## 2023-07-11 NOTE — Telephone Encounter (Signed)
 Submitted PA through cover my meds. Waiting on response from insurance company

## 2023-07-12 ENCOUNTER — Encounter: Payer: Self-pay | Admitting: Gastroenterology

## 2023-07-12 NOTE — Telephone Encounter (Signed)
 Medication has been approved with Patient insurance. Emailed Armed forces training and education officer with [employer] to inform him of this information. Approved. This drug has been approved. Approved quantity: 2 units per 28 day(s). The drug has been approved from 06/27/2023 to 07/10/2024. Please call the pharmacy to process your prescription claim. Generic or biosimilar substitution may be required when available and preferred on the formulary.. Authorization Expiration Date: Jul 10, 2024.

## 2023-07-16 ENCOUNTER — Encounter: Payer: Self-pay | Admitting: Cardiovascular Disease

## 2023-07-17 ENCOUNTER — Encounter: Payer: Self-pay | Admitting: Emergency Medicine

## 2023-07-27 ENCOUNTER — Other Ambulatory Visit: Payer: Self-pay | Admitting: Cardiovascular Disease

## 2023-07-27 DIAGNOSIS — I4892 Unspecified atrial flutter: Secondary | ICD-10-CM

## 2023-07-27 MED ORDER — SODIUM CHLORIDE 0.9% FLUSH: 3.0000 mL | INTRAVENOUS | Status: DC | PRN

## 2023-07-27 MED ORDER — SODIUM CHLORIDE 0.9% FLUSH: 3.0000 mL | Freq: Two times a day (BID) | INTRAVENOUS | Status: DC

## 2023-07-27 NOTE — H&P (Signed)
 HPI:  [name] is a 38 year old gentleman with history of  Crohn's disease,  migraines,  smoker since age 6,  GERD  presenting with to the hospital November 17, 2022 with migraine and shortness of breath  Found to have atrial flutter on EKG Who presents Jul 28, 2023 for cardioversion for his atrial flutter   Last seen by myself in clinic May 22, 2023 colonoscopy May 05, 2023, stopped Eliquis  2 days prior Restarted Eliquis  March 1, reports only taking Eliquis  once a day as when he takes it twice a day he gets GI bleeding   There was a delay in cardioversion given he was not taking Eliquis  appropriately Currently reports taking Eliquis  5 mg twice daily for the past month    EKG Interpretation Date/Time:                  Monday May 22 2023 11:01:33 EDT Ventricular Rate:         71 PR Interval:                   QRS Duration:             100 QT Interval:                 380 QTC Calculation:412 R Axis:                         41   Text Interpretation:Atrial flutter with variable A-V block When compared with ECG of 28-Apr-2023 10:20, No significant change was found Confirmed by Belva Boyden [id number]) on 05/22/2023 11:13:17 AM    Other past medical history reviewed In September 2024, he was in rate controlled flutter, timing of onset felt to be several months earlier given waxing waning symptoms of shortness of breath and palpitations Echo with normal LV function, moderate TR, dilated RV and RA Was started on Eliquis  5 twice daily Coupon provided, he had no insurance It was recommended he follow-up with [facility] or [facility] Plan was for cardioversion after 1 month on anticoagulation   colonoscopy May 05, 2023   Seen by GI yesterday April 27, 2023 Flareup of Crohn's symptoms January 2025  bloody diarrhea, weight loss, loss of appetite, along with inflammatory arthritis.  He was seen by Dr. Glorya Larsson, [facility] clinic rheumatologist who  started him on prednisone .       PMH:   has a past medical history of Arthritis, Asthma, Atrial flutter (HCC), Crohn's disease (HCC), External hemorrhoids, Fluttering heart, GERD (gastroesophageal reflux disease), Marijuana use, Migraine headache, Mild mitral regurgitation by prior echocardiogram, Moderate tricuspid regurgitation by prior echocardiogram, and Tobacco abuse.   PSH:         Past Surgical History:  Procedure Laterality Date   COLONOSCOPY       COLONOSCOPY WITH PROPOFOL  N/A 08/14/2015    Procedure: COLONOSCOPY WITH PROPOFOL ;  Surgeon: Marnee Sink, MD;  Location: [facility];  Service: Endoscopy;  Laterality: N/A;   COLONOSCOPY WITH PROPOFOL  N/A 05/05/2023    Procedure: COLONOSCOPY WITH PROPOFOL ;  Surgeon: Selena Daily, MD;  Location: [facility];  Service: Endoscopy;  Laterality: N/A;   POLYPECTOMY   08/14/2015    Procedure: POLYPECTOMY;  Surgeon: Marnee Sink, MD;  Location: [facility];  Service: Endoscopy;;                Current Outpatient Medications  Medication Sig Dispense Refill   apixaban  (ELIQUIS ) 5 MG  TABS tablet Take 5 mg by mouth daily.       propranolol  (INDERAL ) 20 MG tablet Take 1 tablet (20 mg total) by mouth 3 (three) times daily as needed (As needed for palpitations). 270 tablet 3   SUMAtriptan  (IMITREX ) 50 MG tablet Take 1 tablet (50 mg total) by mouth daily as needed for migraine or headache. May repeat in 2 hours if headache persists or recurs. 10 tablet 0   predniSONE  (STERAPRED UNI-PAK 21 TAB) 5 MG (21) TBPK tablet Take 5 mg by mouth daily. (Patient not taking: Reported on 05/22/2023)          No current facility-administered medications for this visit.        Allergies:   Aspirin, Other, Dilaudid [hydromorphone hcl], Morphine and codeine, and Remicade [infliximab]    Social History:  The patient  reports that he has been smoking cigarettes. He started smoking about 13 years ago. He has a 13 pack-year smoking history. He  has never used smokeless tobacco. He reports current alcohol use. He reports current drug use. Drug: Marijuana.    Family History:   family history includes Healthy in his brother, father, and mother.      Review of Systems: Review of Systems  Constitutional: Negative.   HENT: Negative.    Respiratory:  Positive for shortness of breath.   Cardiovascular:  Positive for palpitations.  Gastrointestinal: Negative.   Musculoskeletal: Negative.   Neurological: Negative.   Psychiatric/Behavioral: Negative.    All other systems reviewed and are negative.     PHYSICAL EXAM: VS:  BP 106/73 (BP Location: Left Arm, Patient Position: Sitting, Cuff Size: Normal)   Pulse 52   Ht 6' (1.829 m)   Wt 171 lb 2 oz (77.6 kg)   SpO2 98%   BMI 23.21 kg/m  , BMI Body mass index is 23.21 kg/m. Constitutional:  oriented to person, place, and time. No distress.  HENT:  Head: Grossly normal Eyes:  no discharge. No scleral icterus.  Neck: No JVD, no carotid bruits  Cardiovascular: Irregularly irregular no murmurs appreciated Pulmonary/Chest: Clear to auscultation bilaterally, no wheezes or rails Abdominal: Soft.  no distension.  no tenderness.  Musculoskeletal: Normal range of motion Neurological:  normal muscle tone. Coordination normal. No atrophy Skin: Skin warm and dry Psychiatric: normal affect, pleasant   Recent Labs: 11/16/2022: Hemoglobin 15.1; Platelets 280 11/17/2022: Magnesium 2.4; TSH 0.432 03/22/2023: ALT 32; BUN 16; Creatinine, Ser 1.24; Potassium 4.6; Sodium 145      Lipid Panel Recent Labs       Lab Results  Component Value Date    CHOL 192 03/22/2023    HDL 56 03/22/2023    LDLCALC 123 (H) 03/22/2023    TRIG 68 03/22/2023             Wt Readings from Last 3 Encounters:  05/22/23 171 lb 2 oz (77.6 kg)  05/05/23 167 lb 12.8 oz (76.1 kg)  04/27/23 174 lb 4 oz (79 kg)      ASSESSMENT AND PLAN:   Problem List Items Addressed This Visit              Cardiology  Problems    New onset atrial flutter (HCC) - Primary    Relevant Orders    EKG 12-Lead (Completed)    Cardiomegaly    Relevant Orders    EKG 12-Lead (Completed)        Other    Tobacco dependence (Chronic)    Chest pain  Other Visit Diagnoses         SOB (shortness of breath)        Relevant Orders    EKG 12-Lead (Completed)           atrial flutter, typical Seen in the hospital September 2024 for atrial flutter started on Eliquis  at that time Presented in February prior to colonoscopy remains in atrial flutter -Presenting today for cardioversion for atrial flutter, compliant with Eliquis  5 twice daily   Crohn's disease of colon Managed by GI, recent colonoscopy Reports he is scheduled to start Humira  given GI bleeding   Smoker We have encouraged him to continue to work on weaning his cigarettes and smoking cessation. He will continue to work on this and does not want any assistance with chantix.       Signed, Juanda Noon, M.D., Ph.D. [facility] [place], [place] [phone]

## 2023-07-28 ENCOUNTER — Encounter: Payer: Self-pay | Admitting: Cardiovascular Disease

## 2023-07-28 ENCOUNTER — Ambulatory Visit
Admission: RE | Admit: 2023-07-28 | Discharge: 2023-07-28 | Disposition: A | Discharge status: Home / Self Care | Attending: Cardiovascular Disease | Admitting: Cardiovascular Disease

## 2023-07-28 ENCOUNTER — Encounter
Admission: RE | Disposition: A | Discharge status: Home / Self Care | Source: Home / Self Care | Attending: Cardiovascular Disease

## 2023-07-28 ENCOUNTER — Ambulatory Visit: Admitting: Anesthesiology

## 2023-07-28 ENCOUNTER — Other Ambulatory Visit: Payer: Self-pay

## 2023-07-28 DIAGNOSIS — G43909 Migraine, unspecified, not intractable, without status migrainosus: Secondary | ICD-10-CM | POA: Diagnosis not present

## 2023-07-28 DIAGNOSIS — K50911 Crohn's disease, unspecified, with rectal bleeding: Secondary | ICD-10-CM | POA: Diagnosis not present

## 2023-07-28 DIAGNOSIS — I4891 Unspecified atrial fibrillation: Secondary | ICD-10-CM | POA: Insufficient documentation

## 2023-07-28 DIAGNOSIS — R0602 Shortness of breath: Secondary | ICD-10-CM | POA: Diagnosis not present

## 2023-07-28 DIAGNOSIS — F17210 Nicotine dependence, cigarettes, uncomplicated: Secondary | ICD-10-CM | POA: Diagnosis not present

## 2023-07-28 DIAGNOSIS — K219 Gastro-esophageal reflux disease without esophagitis: Secondary | ICD-10-CM | POA: Insufficient documentation

## 2023-07-28 DIAGNOSIS — J45909 Unspecified asthma, uncomplicated: Secondary | ICD-10-CM | POA: Diagnosis not present

## 2023-07-28 DIAGNOSIS — I4892 Unspecified atrial flutter: Secondary | ICD-10-CM

## 2023-07-28 DIAGNOSIS — Z7901 Long term (current) use of anticoagulants: Secondary | ICD-10-CM | POA: Insufficient documentation

## 2023-07-28 DIAGNOSIS — Z01812 Encounter for preprocedural laboratory examination: Secondary | ICD-10-CM | POA: Diagnosis not present

## 2023-07-28 HISTORY — PX: CARDIOVERSION: SHX1299

## 2023-07-28 SURGERY — CARDIOVERSION
Anesthesia: General

## 2023-07-28 MED ORDER — PROPOFOL 10 MG/ML IV BOLUS
INTRAVENOUS | Status: DC | PRN
  Administered 2023-07-28: 30 mg via INTRAVENOUS
  Administered 2023-07-28: 70 mg via INTRAVENOUS

## 2023-07-28 MED ORDER — SODIUM CHLORIDE 0.9 % IV SOLN: INTRAVENOUS | Status: DC

## 2023-07-28 NOTE — Anesthesia Preprocedure Evaluation (Signed)
 Anesthesia Evaluation  Patient identified by MRN, date of birth, ID band Patient awake    Reviewed: Allergy & Precautions, NPO status , Patient's Chart, lab work & pertinent test results  History of Anesthesia Complications Negative for: history of anesthetic complications  Airway Mallampati: III  TM Distance: >3 FB Neck ROM: full    Dental  (+) Chipped, Poor Dentition   Pulmonary shortness of breath, asthma , Current Smoker and Patient abstained from smoking.   Pulmonary exam normal        Cardiovascular Exercise Tolerance: Good + DOE  + dysrhythmias Atrial Fibrillation  Rhythm:irregular Rate:Normal     Neuro/Psych  Headaches PSYCHIATRIC DISORDERS         GI/Hepatic Neg liver ROS,GERD  Controlled,,  Endo/Other  negative endocrine ROS    Renal/GU negative Renal ROS  negative genitourinary   Musculoskeletal   Abdominal   Peds  Hematology negative hematology ROS (+)   Anesthesia Other Findings Past Medical History: No date: Anal fissure No date: Arthritis     Comment:  hips and legs No date: Asthma     Comment:  childhood No date: Atrial flutter (HCC) No date: Crohn's disease (HCC) 05/05/2023: Crohn's disease of both small and large intestine with  rectal bleeding (HCC) No date: External hemorrhoids No date: Fluttering heart No date: GERD (gastroesophageal reflux disease) No date: Marijuana use No date: Migraine headache     Comment:  daily No date: Mild mitral regurgitation by prior echocardiogram No date: Moderate tricuspid regurgitation by prior echocardiogram 11/17/2022: Shortness of breath No date: Tobacco abuse  Past Surgical History: No date: COLONOSCOPY 08/14/2015: COLONOSCOPY WITH PROPOFOL ; N/A     Comment:  Procedure: COLONOSCOPY WITH PROPOFOL ;  Surgeon: Marnee Sink, MD;  Location: [facility];  Service:               Endoscopy;  Laterality: N/A; 05/05/2023: COLONOSCOPY  WITH PROPOFOL ; N/A     Comment:  Procedure: COLONOSCOPY WITH PROPOFOL ;  Surgeon: Selena Daily, MD;  Location: [facility];                Service: Endoscopy;  Laterality: N/A; 08/14/2015: POLYPECTOMY     Comment:  Procedure: POLYPECTOMY;  Surgeon: Marnee Sink, MD;                Location: [facility];  Service: Endoscopy;;  BMI    Body Mass Index: 22.85 kg/m      Reproductive/Obstetrics negative OB ROS                             Anesthesia Physical Anesthesia Plan  ASA: 3  Anesthesia Plan: General   Post-op Pain Management:    Induction: Intravenous  PONV Risk Score and Plan: Propofol  infusion and TIVA  Airway Management Planned: Natural Airway and Nasal Cannula  Additional Equipment:   Intra-op Plan:   Post-operative Plan:   Informed Consent: I have reviewed the patients History and Physical, chart, labs and discussed the procedure including the risks, benefits and alternatives for the proposed anesthesia with the patient or authorized representative who has indicated his/her understanding and acceptance.     Dental Advisory Given  Plan Discussed with: Anesthesiologist, CRNA and Surgeon  Anesthesia Plan Comments: (Patient consented for risks of anesthesia including but  not limited to:  - adverse reactions to medications - risk of airway placement if required - damage to eyes, teeth, lips or other oral mucosa - nerve damage due to positioning  - sore throat or hoarseness - Damage to heart, brain, nerves, lungs, other parts of body or loss of life  Patient voiced understanding and assent.)       Anesthesia Quick Evaluation

## 2023-07-28 NOTE — CV Procedure (Signed)
Cardioversion procedure note For atrial flutter, typical  Procedure Details:  Consent: Risks of procedure as well as the alternatives and risks of each were explained to the (patient/caregiver). Consent for procedure obtained.  Time Out: Verified patient identification, verified procedure, site/side was marked, verified correct patient position, special equipment/implants available, medications/allergies/relevent history reviewed, required imaging and test results available. Performed  Patient placed on cardiac monitor, pulse oximetry, supplemental oxygen as necessary.  Sedation given: propofol IV, Dr. Piscitello Pacer pads placed anterior and posterior chest.   Cardioverted 1 time(s).  Cardioverted at  150J. Synchronized biphasic Converted to NSR   Evaluation: Findings: Post procedure EKG shows: NSR Complications: None Patient did tolerate procedure well.  Time Spent Directly with the Patient:  45 minutes   Tim Lavine Hargrove, M.D., Ph.D. 

## 2023-07-28 NOTE — Transfer of Care (Signed)
 Immediate Anesthesia Transfer of Care Note  Patient: Marcus Gordon  Procedure(s) Performed: CARDIOVERSION  Patient Location: spu  Anesthesia Type:General  Level of Consciousness: awake and alert   Airway & Oxygen Therapy: Patient Spontanous Breathing  Post-op Assessment: Report given to RN and Post -op Vital signs reviewed and stable  Post vital signs: Reviewed  Last Vitals:  Vitals Value Taken Time  BP 103/68 07/28/23 0739  Temp    Pulse 57 07/28/23 0742  Resp 20 07/28/23 0742  SpO2 99 % 07/28/23 0742    Last Pain:  Vitals:   07/28/23 0653  TempSrc: Oral  PainSc: 0-No pain         Complications: No notable events documented.

## 2023-07-28 NOTE — Anesthesia Postprocedure Evaluation (Signed)
 Anesthesia Post Note  Patient: Marcus Gordon  Procedure(s) Performed: CARDIOVERSION  Patient location during evaluation: Specials Recovery Anesthesia Type: General Level of consciousness: awake and alert Pain management: pain level controlled Vital Signs Assessment: post-procedure vital signs reviewed and stable Respiratory status: spontaneous breathing, nonlabored ventilation, respiratory function stable and patient connected to nasal cannula oxygen Cardiovascular status: blood pressure returned to baseline and stable Postop Assessment: no apparent nausea or vomiting Anesthetic complications: no   No notable events documented.   Last Vitals:  Vitals:   07/28/23 0745 07/28/23 0800  BP: 112/71 101/80  Pulse: (!) 59 (!) 56  Resp: 17 16  Temp:    SpO2: 100% 99%    Last Pain:  Vitals:   07/28/23 0815  TempSrc:   PainSc: 0-No pain                 Portia Brittle Welton Bord

## 2023-08-01 ENCOUNTER — Encounter: Payer: Self-pay | Admitting: Cardiovascular Disease

## 2023-08-23 ENCOUNTER — Ambulatory Visit: Attending: Nurse Practitioner | Admitting: Nurse Practitioner

## 2023-08-23 ENCOUNTER — Encounter: Payer: Self-pay | Admitting: Nurse Practitioner

## 2023-08-23 VITALS — BP 110/60 | HR 62 | Ht 72.0 in | Wt 168.2 lb

## 2023-08-23 DIAGNOSIS — K50111 Crohn's disease of large intestine with rectal bleeding: Secondary | ICD-10-CM

## 2023-08-23 DIAGNOSIS — I483 Typical atrial flutter: Secondary | ICD-10-CM

## 2023-08-23 DIAGNOSIS — F17200 Nicotine dependence, unspecified, uncomplicated: Secondary | ICD-10-CM | POA: Diagnosis not present

## 2023-08-23 NOTE — Patient Instructions (Signed)
 Medication Instructions:  Your physician recommends the following medication changes.  STOP TAKING: Eliquis  after finishing your last bottle - congrats!  *If you need a refill on your cardiac medications before your next appointment, please call your pharmacy*  Lab Work: None ordered at this time   Follow-Up: At [facility], you and your health needs are our priority.  As part of our continuing mission to provide you with exceptional heart care, our providers are all part of one team.  This team includes your primary Cardiologist (physician) and Advanced Practice Providers or APPs (Physician Assistants and Nurse Practitioners) who all work together to provide you with the care you need, when you need it.  Your next appointment:   3 month(s)  Provider:   You may see Timothy Gollan, MD or Laneta Pintos, NP

## 2023-08-23 NOTE — Progress Notes (Signed)
 Office Visit    Patient Name: Marcus Gordon Date of Encounter: 08/23/2023  Primary Care Provider:  Lamon Pillow, MD Primary Cardiologist:  Belva Boyden, MD  Chief Complaint    38 y.o. male with a history of persistent atrial flutter, tobacco abuse, Crohn's disease, migraines, mild mitral regurgitation, and moderate tricuspid regurgitation, who presents for follow-up after recent cardioversion.  Past Medical History   Subjective   Past Medical History:  Diagnosis Date   Anal fissure    Arthritis    hips and legs   Asthma    childhood   Atrial flutter (HCC)    a. Dx 11/2022-->Eliquis  (CHA2DS2VASc = 0); b. 11/2022 Echo: EF 55-60%, no rwma, mildly reduced RV fxn, RVSP 25.4mmHg, mildly dil RA, mild MR, mod TR; c. 07/2023 DCCV (150J).   Crohn's disease (HCC)    Crohn's disease of both small and large intestine with rectal bleeding (HCC) 05/05/2023   External hemorrhoids    GERD (gastroesophageal reflux disease)    Marijuana use    Migraine headache    daily   Mild mitral regurgitation by prior echocardiogram    Moderate tricuspid regurgitation by prior echocardiogram    Tobacco abuse    Past Surgical History:  Procedure Laterality Date   CARDIOVERSION N/A 07/28/2023   Procedure: CARDIOVERSION;  Surgeon: Devorah Fonder, MD;  Location: [facility] ORS;  Service: Cardiovascular;  Laterality: N/A;   COLONOSCOPY     COLONOSCOPY WITH PROPOFOL  N/A 08/14/2015   Procedure: COLONOSCOPY WITH PROPOFOL ;  Surgeon: Marnee Sink, MD;  Location: [facility];  Service: Endoscopy;  Laterality: N/A;   COLONOSCOPY WITH PROPOFOL  N/A 05/05/2023   Procedure: COLONOSCOPY WITH PROPOFOL ;  Surgeon: Selena Daily, MD;  Location: [facility];  Service: Endoscopy;  Laterality: N/A;   POLYPECTOMY  08/14/2015   Procedure: POLYPECTOMY;  Surgeon: Marnee Sink, MD;  Location: [facility];  Service: Endoscopy;;    Allergies  Allergies  Allergen Reactions   Aspirin     GI  upset   Other     Anti-inflammatory-GI upset   Dilaudid [Hydromorphone Hcl] Rash    Itching   Morphine And Codeine Rash   Remicade [Infliximab] Rash    Gastrointestinal agents       History of Present Illness      38 y.o. y/o male with the above past medical history including persistent atrial flutter, tobacco abuse, Crohn's disease, migraines, mild MR, and moderate TR.  He was admitted to [facility] regional in September 2024 in the setting of tachypalpitations associated with dyspnea, chest tightness, and migraine.  In the ED, he was found to be in atrial flutter with variable conduction and rates in the 50s to 70s.  CTA of the chest was negative for PE.  Echocardiogram was performed and showed an EF of 55-60% with mildly reduced RV function, mildly dilated right atrium, mild MR, and moderate TR.  He was placed on Eliquis  5 mg twice daily with plan for outpatient follow-up and cardioversion.  He subsequently had a flare of his Crohn's disease in early 2025 and came off of Eliquis  for short period of time to complete colonoscopy.  He resumed Eliquis  in early March 2025 but was only taking it once a day in the setting of GI bleeding when taken twice a day.  He was started on Humira  and was subsequently able to tolerate twice daily Eliquis .  He underwent successful cardioversion on Jul 28, 2023.   Since his cardioversion, he has  felt exceptionally well.  He no longer experiences dyspnea on exertion or fatigue.  He is maintaining sinus rhythm on ECG today.  He denies chest pain, palpitations, dyspnea, pnd, orthopnea, n, v, dizziness, syncope, edema, weight gain, or early satiety.  We discussed possible referral to electrophysiology to discuss ablation however, he is not currently interested. Objective   Home Medications    Current Outpatient Medications  Medication Sig Dispense Refill   adalimumab  (HUMIRA ) 80 MG/0.8ML pen Inject 80 mg as directed every other day.     SUMAtriptan  (IMITREX ) 50 MG  tablet Take 1 tablet (50 mg total) by mouth daily as needed for migraine or headache. May repeat in 2 hours if headache persists or recurs. (Patient not taking: Reported on 08/23/2023) 10 tablet 0   No current facility-administered medications for this visit.     Physical Exam    VS:  BP 110/60 (BP Location: Left Arm, Patient Position: Sitting, Cuff Size: Normal)   Pulse 62   Ht 6' (1.829 m)   Wt 168 lb 3.2 oz (76.3 kg)   SpO2 98%   BMI 22.81 kg/m  , BMI Body mass index is 22.81 kg/m.       GEN: Thin, in no acute distress. HEENT: normal. Neck: Supple, no JVD, carotid bruits, or masses. Cardiac: RRR, no murmurs, rubs, or gallops. No clubbing, cyanosis, edema.  Radials 2+/PT 2+ and equal bilaterally.  Respiratory:  Respirations regular and unlabored, clear to auscultation bilaterally. GI: Soft, nontender, nondistended, BS + x 4. MS: no deformity or atrophy. Skin: warm and dry, no rash. Neuro:  Strength and sensation are intact. Psych: Normal affect.  Accessory Clinical Findings    ECG personally reviewed by me today - EKG Interpretation Date/Time:  Wednesday August 23 2023 10:18:23 EDT Ventricular Rate:  62 PR Interval:  158 QRS Duration:  102 QT Interval:  392 QTC Calculation: 397 R Axis:   16  Text Interpretation: Normal sinus rhythm Normal ECG Confirmed by Laneta Pintos [phone]) on 08/23/2023 10:40:27 AM  - no acute changes.  Lab Results  Component Value Date   WBC 5.7 07/10/2023   HGB 14.6 07/10/2023   HCT 43.0 07/10/2023   MCV 96.4 07/10/2023   PLT 223 07/10/2023   Lab Results  Component Value Date   CREATININE 1.12 07/10/2023   BUN 17 07/10/2023   NA 140 07/10/2023   K 3.9 07/10/2023   CL 110 07/10/2023   CO2 22 07/10/2023   Lab Results  Component Value Date   ALT 32 03/22/2023   AST 28 03/22/2023   ALKPHOS 142 (H) 03/22/2023   BILITOT 0.6 03/22/2023   Lab Results  Component Value Date   CHOL 192 03/22/2023   HDL 56 03/22/2023   LDLCALC 123 (H)  03/22/2023   TRIG 68 03/22/2023   CHOLHDL 3.4 03/22/2023    Lab Results  Component Value Date   TSH 0.432 11/17/2022       Assessment & Plan    1.  Persistent Atrial Flutter: Atrial flutter diagnosed in September 2024 and was persistent and rate controlled until cardioversion in May.  He has been anticoagulated with Eliquis  in the setting of a CHA2DS2-VASc of 0.  He is maintaining sinus rhythm and notes significant improvement in dyspnea and activity tolerance since his cardioversion.  He will have completed 4 weeks of Eliquis  on June 23, and he is interested in coming off of it due to cost and concerns related to history of Crohn's and prior GI bleeding.  He will complete his current bottle of Eliquis  and then discontinue.  I did offer him referral to electrophysiology for consideration of a flutter ablation however at this time, he is not interested.  2.  Tobacco abuse: Still smoking a few cigarettes a day.  Complete cessation advised.  3.  Crohn's disease: Followed by GI.  Currently without issues on Humira .  4.  Disposition: Follow-up in cardiology clinic in 3 months or sooner if necessary.  Laneta Pintos, NP 08/23/2023, 11:03 AM

## 2023-11-01 ENCOUNTER — Encounter: Payer: Self-pay | Admitting: Dermatology

## 2023-11-01 ENCOUNTER — Ambulatory Visit (INDEPENDENT_AMBULATORY_CARE_PROVIDER_SITE_OTHER): Admitting: Dermatology

## 2023-11-01 DIAGNOSIS — L219 Seborrheic dermatitis, unspecified: Secondary | ICD-10-CM

## 2023-11-01 DIAGNOSIS — Z7189 Other specified counseling: Secondary | ICD-10-CM

## 2023-11-01 DIAGNOSIS — L408 Other psoriasis: Secondary | ICD-10-CM

## 2023-11-01 DIAGNOSIS — L2489 Irritant contact dermatitis due to other agents: Secondary | ICD-10-CM

## 2023-11-01 DIAGNOSIS — L989 Disorder of the skin and subcutaneous tissue, unspecified: Secondary | ICD-10-CM

## 2023-11-01 DIAGNOSIS — L739 Follicular disorder, unspecified: Secondary | ICD-10-CM

## 2023-11-01 DIAGNOSIS — F633 Trichotillomania: Secondary | ICD-10-CM

## 2023-11-01 DIAGNOSIS — Z79899 Other long term (current) drug therapy: Secondary | ICD-10-CM

## 2023-11-01 DIAGNOSIS — L280 Lichen simplex chronicus: Secondary | ICD-10-CM

## 2023-11-01 MED ORDER — KETOCONAZOLE 2 % EX SHAM: MEDICATED_SHAMPOO | CUTANEOUS | 11 refills | Status: AC

## 2023-11-01 MED ORDER — MUPIROCIN 2 % EX OINT: TOPICAL_OINTMENT | CUTANEOUS | 1 refills | Status: AC

## 2023-11-01 MED ORDER — DOXYCYCLINE MONOHYDRATE 100 MG PO CAPS: ORAL_CAPSULE | ORAL | 0 refills | Status: AC

## 2023-11-01 MED ORDER — MOMETASONE FUROATE 0.1 % EX CREA: TOPICAL_CREAM | CUTANEOUS | 0 refills | Status: AC

## 2023-11-01 NOTE — Patient Instructions (Signed)

## 2023-11-01 NOTE — Progress Notes (Signed)
   Follow-Up Visit   Subjective  Marcus Gordon is a 38 y.o. male who presents for the following: Rash on the abdomen and axillary area, very painful. Hx of folliculitis. Pt currently using TMC but it doesn't seem to be helping. Pt c/o small bumps on the penis that he would like checked today. Pt states that scalp is still bothersome and inflamed and he picks at lesions pulling out his hair.  The following portions of the chart were reviewed this encounter and updated as appropriate: medications, allergies, medical history  Review of Systems:  No other skin or systemic complaints except as noted in HPI or Assessment and Plan.  Objective  Well appearing patient in no apparent distress; mood and affect are within normal limits.  A focused examination was performed of the following areas: the abdomen and axillary areas  Relevant exam findings are noted in the Assessment and Plan.    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  Folliculitis occurs due to inflammation of the superficial hair follicle (pore), resulting in acne-like lesions (pus bumps). It can be infectious (bacterial, fungal) or noninfectious (shaving, tight clothing, heat/sweat, medications).  Folliculitis can be acute or chronic and recommended treatment depends on the underlying cause of folliculitis.  Treatment Plan: Start Doxycycline  100 mg po BID x 1 week. Doxycycline  should be taken with food to prevent nausea. Do not lay down for 30 minutes after taking. Be cautious with sun exposure and use good sun protection while on this medication. Pregnant women should not take this medication.   Start Mupirocin  2% ointment to aa BID until healed.  Start mometasone  0.1% cream to aa's abdomen and axilla BID PRN. Topical steroids (such as triamcinolone, fluocinolone, fluocinonide,  mometasone , clobetasol, halobetasol, betamethasone, hydrocortisone) can cause thinning and lightening of the skin if they are used for too long in the same area. Your physician has selected the right strength medicine for your problem and area affected on the body. Please use your medication only as directed by your physician to prevent side effects.   SEBORRHEIC DERMATITIS/sebopsoriasis with trichotillomania  Exam: Pink patches with greasy scale at the scalp Chronic and persistent condition with duration or expected duration over one year. Condition is symptomatic/ bothersome to patient. Not currently at goal. Seborrheic Dermatitis is a chronic persistent rash characterized by pinkness and scaling most commonly of the mid face but also can occur on the scalp (dandruff), ears; mid chest, mid back and groin.  It tends to be exacerbated by stress and cooler weather.  People who have neurologic disease may experience new onset or exacerbation of existing seborrheic dermatitis.  The condition is not curable but treatable and can be controlled. Treatment Plan: Continue Ketoconazole  2% shampoo twice weekly. Let sit for 5-10 minutes then wash out.   Enlarged oil glands on the penis  - reassured benign, observe, RTC if any changes. Variant of normal. No evidence of Condyloma today. FOLLICULITIS   SEBORRHEIC DERMATITIS   IRRITANT CONTACT DERMATITIS DUE TO OTHER AGENTS   LICHEN SIMPLEX CHRONICUS    Return if symptoms worsen or fail to improve.  LILLETTE Rosina Mayans, CMA, am acting as scribe for Alm Rhyme, MD .   Documentation: I have reviewed the above documentation for accuracy and completeness, and I agree with the above.  Alm Rhyme, MD

## 2023-11-23 ENCOUNTER — Encounter: Payer: Self-pay | Admitting: Nurse Practitioner

## 2023-11-23 ENCOUNTER — Ambulatory Visit: Attending: Nurse Practitioner | Admitting: Nurse Practitioner

## 2023-11-23 VITALS — BP 110/60 | HR 60 | Ht 72.0 in | Wt 169.0 lb

## 2023-11-23 DIAGNOSIS — K50111 Crohn's disease of large intestine with rectal bleeding: Secondary | ICD-10-CM | POA: Insufficient documentation

## 2023-11-23 DIAGNOSIS — F17200 Nicotine dependence, unspecified, uncomplicated: Secondary | ICD-10-CM | POA: Diagnosis not present

## 2023-11-23 DIAGNOSIS — I483 Typical atrial flutter: Secondary | ICD-10-CM | POA: Diagnosis not present

## 2023-11-23 NOTE — Progress Notes (Signed)
 Office Visit    Patient Name: Marcus Gordon Date of Encounter: 11/23/2023  Primary Care Provider:  Gasper Nancyann BRAVO, MD Primary Cardiologist:  Timothy Gollan, MD  Cardiology APP:  Vivienne Lonni Ingle, NP   Chief Complaint    38 y.o. male with a history of persistent atrial flutter (cardioverted 07/2023), tobacco abuse, Crohn's disease, migraines, mild mitral regurgitation, and moderate tricuspid regurgitation, who presents for f/u of Aflutter.  Past Medical History   Subjective   Past Medical History:  Diagnosis Date   Anal fissure    Arthritis    hips and legs   Asthma    childhood   Atrial flutter (HCC)    a. Dx 11/2022-->Eliquis  (CHA2DS2VASc = 0); b. 11/2022 Echo: EF 55-60%, no rwma, mildly reduced RV fxn, RVSP 25.45mmHg, mildly dil RA, mild MR, mod TR; c. 07/2023 DCCV (150J).   Crohn's disease (HCC)    Crohn's disease of both small and large intestine with rectal bleeding (HCC) 05/05/2023   External hemorrhoids    GERD (gastroesophageal reflux disease)    Marijuana use    Migraine headache    daily   Mild mitral regurgitation by prior echocardiogram    Moderate tricuspid regurgitation by prior echocardiogram    Tobacco abuse    Past Surgical History:  Procedure Laterality Date   CARDIOVERSION N/A 07/28/2023   Procedure: CARDIOVERSION;  Surgeon: Perla Evalene PARAS, MD;  Location: [facility] ORS;  Service: Cardiovascular;  Laterality: N/A;   COLONOSCOPY     COLONOSCOPY WITH PROPOFOL  N/A 08/14/2015   Procedure: COLONOSCOPY WITH PROPOFOL ;  Surgeon: Rogelia Copping, MD;  Location: [facility];  Service: Endoscopy;  Laterality: N/A;   COLONOSCOPY WITH PROPOFOL  N/A 05/05/2023   Procedure: COLONOSCOPY WITH PROPOFOL ;  Surgeon: Unk Corinn Skiff, MD;  Location: [facility];  Service: Endoscopy;  Laterality: N/A;   POLYPECTOMY  08/14/2015   Procedure: POLYPECTOMY;  Surgeon: Rogelia Copping, MD;  Location: [facility];  Service: Endoscopy;;     Allergies  Allergies  Allergen Reactions   Aspirin     GI upset   Other     Anti-inflammatory-GI upset   Dilaudid [Hydromorphone Hcl] Rash    Itching   Morphine And Codeine Rash   Remicade [Infliximab] Rash    Gastrointestinal agents       History of Present Illness      38 y.o. y/o male with the above past medical history including persistent atrial flutter, tobacco abuse, Crohn's disease, migraines, mild MR, and moderate TR.  He was admitted to [facility] regional in September 2024 in the setting of tachypalpitations associated with dyspnea, chest tightness, and migraine.  In the ED, he was found to be in atrial flutter with variable conduction and rates in the 50s to 70s.  CTA of the chest was negative for PE.  Echocardiogram was performed and showed an EF of 55-60% with mildly reduced RV function, mildly dilated right atrium, mild MR, and moderate TR.  He was placed on Eliquis  5 mg twice daily with plan for outpatient follow-up and cardioversion.  He subsequently had a flare of his Crohn's disease in early 2025 and came off of Eliquis  for short period of time to complete colonoscopy.  He resumed Eliquis  in early March 2025 but was only taking it once a day in the setting of GI bleeding when taken twice a day.  He was started on Humira  and was subsequently able to tolerate twice daily Eliquis .  He underwent successful cardioversion on  Jul 28, 2023.    He was last seen in clinic in 08/2023, at which time he was doing well.  He was not interested in EP referral for ablation.  Since his last visit, he has done well.  He denies any recurrence of palpitations, chest pain, dyspnea, or fatigue.  He is exercising 3-4 times per week, walking and sometimes jogging a track, and also doing resistance exercises such as squatting, pull-ups, and push-ups.  Overall, he feels well.  He has been sleeping well and feels well rested in the morning.  He denies PND, orthopnea, dizziness, syncope, edema, or early  satiety.  Objective   Home Medications    Current Outpatient Medications  Medication Sig Dispense Refill   adalimumab  (HUMIRA ) 80 MG/0.8ML pen Inject 80 mg as directed every other day. (Patient taking differently: Inject 80 mg as directed. Every other week)     doxycycline  (MONODOX ) 100 MG capsule Take one cap po BID x 7 days. Take with food and plenty of drink. (Patient not taking: Reported on 11/23/2023) 14 capsule 0   HUMIRA , 2 PEN, 40 MG/0.4ML pen Inject 40 mg as directed. Every other week (Patient not taking: Reported on 11/23/2023)     ketoconazole  (NIZORAL ) 2 % shampoo Massage into scalp let sit 5 minutes then wash out. Use twice per week. (Patient not taking: Reported on 11/23/2023) 120 mL 11   mometasone  (ELOCON ) 0.1 % cream Apply to rash BID PRN 5d/wk. (Patient not taking: Reported on 11/23/2023) 45 g 0   mupirocin  ointment (BACTROBAN ) 2 % Apply to any sores/wounds/bites BID until healed. (Patient not taking: Reported on 11/23/2023) 22 g 1   SUMAtriptan  (IMITREX ) 50 MG tablet Take 1 tablet (50 mg total) by mouth daily as needed for migraine or headache. May repeat in 2 hours if headache persists or recurs. (Patient not taking: Reported on 11/23/2023) 10 tablet 0   No current facility-administered medications for this visit.     Physical Exam    VS:  BP 110/60 (BP Location: Left Arm, Patient Position: Sitting, Cuff Size: Normal)   Pulse 60   Ht 6' (1.829 m)   Wt 169 lb (76.7 kg)   SpO2 98%   BMI 22.92 kg/m  , BMI Body mass index is 22.92 kg/m.          GEN: Well nourished, well developed, in no acute distress. HEENT: normal. Neck: Supple, no JVD, carotid bruits, or masses. Cardiac: RRR, no murmurs, rubs, or gallops. No clubbing, cyanosis, edema.  Radials 2+/PT 2+ and equal bilaterally.  Respiratory:  Respirations regular and unlabored, clear to auscultation bilaterally. GI: Soft, nontender, nondistended, BS + x 4. MS: no deformity or atrophy. Skin: warm and dry, no  rash. Neuro:  Strength and sensation are intact. Psych: Normal affect.  Accessory Clinical Findings    ECG personally reviewed by me today - EKG Interpretation Date/Time:  Thursday November 23 2023 10:32:24 EDT Ventricular Rate:  62 PR Interval:  142 QRS Duration:  112 QT Interval:  398 QTC Calculation: 403 R Axis:   17  Text Interpretation: Normal sinus rhythm Normal ECG Confirmed by Vivienne Bruckner [phone]) on 11/23/2023 10:38:06 AM  - no acute changes.  Lab Results  Component Value Date   WBC 5.7 07/10/2023   HGB 14.6 07/10/2023   HCT 43.0 07/10/2023   MCV 96.4 07/10/2023   PLT 223 07/10/2023   Lab Results  Component Value Date   CREATININE 1.12 07/10/2023   BUN 17 07/10/2023   NA  140 07/10/2023   K 3.9 07/10/2023   CL 110 07/10/2023   CO2 22 07/10/2023   Lab Results  Component Value Date   ALT 32 03/22/2023   AST 28 03/22/2023   ALKPHOS 142 (H) 03/22/2023   BILITOT 0.6 03/22/2023   Lab Results  Component Value Date   CHOL 192 03/22/2023   HDL 56 03/22/2023   LDLCALC 123 (H) 03/22/2023   TRIG 68 03/22/2023   CHOLHDL 3.4 03/22/2023    Lab Results  Component Value Date   TSH 0.432 11/17/2022       Assessment & Plan    1.  Persistent Atrial Flutter: Initially diagnosed in September 2024 with subsequent cardioversion in May 2025.  In the setting of a CHA2DS2-VASc of 0 and h/o Crohn's w/ prior GIB, Eliquis  was discontinued ~ 6 wks post-cardioversion.  He has done well without any recurrence of arrhythmias, palpitations, or fatigue.  We again broached the topic of EP referral for consideration of flutter ablation however, he is not interested at this time.  Strongly encouraged him to quit smoking and refrain from drug and alcohol use (smokes some marijuana and rarely uses alcohol).  2.  Tobacco abuse: Still smoking a few cigarettes a day.  Complete cessation advised.  Discussed use of nicotine  patches and gum for cravings.  3.  Crohn's disease: Followed by  GI.  Currently stable on Humira .  4.  Disposition: Follow-up in 6 months or sooner if necessary.  Lonni Meager, NP 11/23/2023, 12:48 PM

## 2023-11-23 NOTE — Patient Instructions (Signed)
 Medication Instructions:  No changes *If you need a refill on your cardiac medications before your next appointment, please call your pharmacy*  Lab Work: None ordered If you have labs (blood work) drawn today and your tests are completely normal, you will receive your results only by: MyChart Message (if you have MyChart) OR A paper copy in the mail If you have any lab test that is abnormal or we need to change your treatment, we will call you to review the results.  Testing/Procedures: None ordered  Follow-Up: At [facility], you and your health needs are our priority.  As part of our continuing mission to provide you with exceptional heart care, our providers are all part of one team.  This team includes your primary Cardiologist (physician) and Advanced Practice Providers or APPs (Physician Assistants and Nurse Practitioners) who all work together to provide you with the care you need, when you need it.  Your next appointment:   6 month(s)  Provider:   Lonni Meager, NP    We recommend signing up for the patient portal called MyChart.  Sign up information is provided on this After Visit Summary.  MyChart is used to connect with patients for Virtual Visits (Telemedicine).  Patients are able to view lab/test results, encounter notes, upcoming appointments, etc.  Non-urgent messages can be sent to your provider as well.   To learn more about what you can do with MyChart, go to [email or web address].

## 2024-06-07 ENCOUNTER — Ambulatory Visit: Admitting: Nurse Practitioner
